# Patient Record
Sex: Female | Born: 1935 | Race: Black or African American | Hispanic: No | Smoking: Former smoker
Health system: Southern US, Community
[De-identification: ages and names within clinical notes are randomized; demographics above are authoritative.]

## PROBLEM LIST (undated history)

## (undated) DIAGNOSIS — I10 Essential (primary) hypertension: Secondary | ICD-10-CM

## (undated) DIAGNOSIS — R5381 Other malaise: Secondary | ICD-10-CM

## (undated) DIAGNOSIS — R079 Chest pain, unspecified: Secondary | ICD-10-CM

## (undated) DIAGNOSIS — H269 Unspecified cataract: Secondary | ICD-10-CM

## (undated) DIAGNOSIS — M2550 Pain in unspecified joint: Secondary | ICD-10-CM

## (undated) DIAGNOSIS — E785 Hyperlipidemia, unspecified: Secondary | ICD-10-CM

## (undated) DIAGNOSIS — R5383 Other fatigue: Secondary | ICD-10-CM

## (undated) DIAGNOSIS — M109 Gout, unspecified: Secondary | ICD-10-CM

## (undated) DIAGNOSIS — I2510 Atherosclerotic heart disease of native coronary artery without angina pectoris: Secondary | ICD-10-CM

## (undated) DIAGNOSIS — G44209 Tension-type headache, unspecified, not intractable: Secondary | ICD-10-CM

## (undated) DIAGNOSIS — F0150 Vascular dementia without behavioral disturbance: Secondary | ICD-10-CM

## (undated) DIAGNOSIS — M8580 Other specified disorders of bone density and structure, unspecified site: Secondary | ICD-10-CM

## (undated) HISTORY — PX: SPINE SURGERY: SHX786

## (undated) HISTORY — PX: CARDIAC CATHETERIZATION: SHX172

## (undated) HISTORY — DX: Gout, unspecified: M10.9

## (undated) HISTORY — DX: Tension-type headache, unspecified, not intractable: G44.209

## (undated) HISTORY — DX: Pain in unspecified joint: M25.50

## (undated) HISTORY — DX: Hyperlipidemia, unspecified: E78.5

## (undated) HISTORY — DX: Chest pain, unspecified: R07.9

## (undated) HISTORY — DX: Other fatigue: R53.83

## (undated) HISTORY — DX: Other specified disorders of bone density and structure, unspecified site: M85.80

## (undated) HISTORY — DX: Vascular dementia, unspecified severity, without behavioral disturbance, psychotic disturbance, mood disturbance, and anxiety: F01.50

## (undated) HISTORY — DX: Atherosclerotic heart disease of native coronary artery without angina pectoris: I25.10

## (undated) HISTORY — DX: Other malaise: R53.81

## (undated) HISTORY — DX: Unspecified cataract: H26.9

## (undated) HISTORY — DX: Essential (primary) hypertension: I10

---

## 1958-09-09 HISTORY — PX: TUBAL LIGATION: SHX77

## 2008-09-09 DIAGNOSIS — H269 Unspecified cataract: Secondary | ICD-10-CM

## 2008-09-09 HISTORY — DX: Unspecified cataract: H26.9

## 2010-12-13 ENCOUNTER — Emergency Department (HOSPITAL_COMMUNITY)
Admission: EM | Admit: 2010-12-13 | Discharge: 2010-12-13 | Disposition: A | Discharge status: Home / Self Care | Payer: Medicare Other | Attending: Emergency Medicine | Admitting: Emergency Medicine

## 2010-12-13 DIAGNOSIS — F0390 Unspecified dementia without behavioral disturbance: Secondary | ICD-10-CM | POA: Insufficient documentation

## 2010-12-13 DIAGNOSIS — I2510 Atherosclerotic heart disease of native coronary artery without angina pectoris: Secondary | ICD-10-CM | POA: Insufficient documentation

## 2010-12-13 DIAGNOSIS — I10 Essential (primary) hypertension: Secondary | ICD-10-CM | POA: Insufficient documentation

## 2010-12-13 DIAGNOSIS — M7660 Achilles tendinitis, unspecified leg: Secondary | ICD-10-CM | POA: Insufficient documentation

## 2010-12-13 DIAGNOSIS — M79609 Pain in unspecified limb: Secondary | ICD-10-CM | POA: Insufficient documentation

## 2011-01-24 ENCOUNTER — Other Ambulatory Visit: Payer: Self-pay | Admitting: Internal Medicine

## 2011-01-24 DIAGNOSIS — Z780 Asymptomatic menopausal state: Secondary | ICD-10-CM

## 2011-01-24 DIAGNOSIS — Z1231 Encounter for screening mammogram for malignant neoplasm of breast: Secondary | ICD-10-CM

## 2011-02-01 ENCOUNTER — Ambulatory Visit
Admission: RE | Admit: 2011-02-01 | Discharge: 2011-02-01 | Disposition: A | Discharge status: Home / Self Care | Payer: Medicare Other | Source: Ambulatory Visit | Attending: Internal Medicine | Admitting: Internal Medicine

## 2011-02-01 DIAGNOSIS — Z1231 Encounter for screening mammogram for malignant neoplasm of breast: Secondary | ICD-10-CM

## 2011-02-01 DIAGNOSIS — Z780 Asymptomatic menopausal state: Secondary | ICD-10-CM

## 2011-02-01 LAB — HM DEXA SCAN: HM Dexa Scan: NORMAL

## 2011-08-16 ENCOUNTER — Encounter (HOSPITAL_COMMUNITY): Payer: Self-pay | Admitting: *Deleted

## 2011-08-16 ENCOUNTER — Emergency Department (HOSPITAL_COMMUNITY)
Admission: EM | Admit: 2011-08-16 | Discharge: 2011-08-17 | Disposition: A | Discharge status: Home / Self Care | Payer: Medicare Other | Attending: Emergency Medicine | Admitting: Emergency Medicine

## 2011-08-16 DIAGNOSIS — R10814 Left lower quadrant abdominal tenderness: Secondary | ICD-10-CM | POA: Insufficient documentation

## 2011-08-16 DIAGNOSIS — R109 Unspecified abdominal pain: Secondary | ICD-10-CM | POA: Insufficient documentation

## 2011-08-16 DIAGNOSIS — N390 Urinary tract infection, site not specified: Secondary | ICD-10-CM | POA: Insufficient documentation

## 2011-08-16 DIAGNOSIS — K5900 Constipation, unspecified: Secondary | ICD-10-CM | POA: Insufficient documentation

## 2011-08-16 NOTE — ED Notes (Signed)
Pt in c/o abd pain x1 month with diarrhea, states decreased intake, also some twitching that she been evaluated for by PMD with no diagnosis

## 2011-08-17 ENCOUNTER — Emergency Department (HOSPITAL_COMMUNITY): Payer: Medicare Other

## 2011-08-17 LAB — URINALYSIS, ROUTINE W REFLEX MICROSCOPIC
Nitrite: NEGATIVE
Specific Gravity, Urine: 1.025 (ref 1.005–1.030)
Urobilinogen, UA: 0.2 mg/dL (ref 0.0–1.0)
pH: 6 (ref 5.0–8.0)

## 2011-08-17 LAB — DIFFERENTIAL
Basophils Relative: 0 % (ref 0–1)
Eosinophils Absolute: 0.2 10*3/uL (ref 0.0–0.7)
Eosinophils Relative: 5 % (ref 0–5)
Lymphs Abs: 2.6 10*3/uL (ref 0.7–4.0)
Neutrophils Relative %: 35 % — ABNORMAL LOW (ref 43–77)

## 2011-08-17 LAB — CBC
MCH: 28.4 pg (ref 26.0–34.0)
MCHC: 33.1 g/dL (ref 30.0–36.0)
MCV: 85.7 fL (ref 78.0–100.0)
Platelets: 238 10*3/uL (ref 150–400)
RBC: 4.19 MIL/uL (ref 3.87–5.11)

## 2011-08-17 LAB — COMPREHENSIVE METABOLIC PANEL
ALT: 7 U/L (ref 0–35)
Albumin: 3.9 g/dL (ref 3.5–5.2)
BUN: 14 mg/dL (ref 6–23)
Calcium: 10.2 mg/dL (ref 8.4–10.5)
GFR calc Af Amer: 44 mL/min — ABNORMAL LOW (ref >90)
Glucose, Bld: 86 mg/dL (ref 70–99)
Potassium: 3.8 mEq/L (ref 3.5–5.1)
Sodium: 142 mEq/L (ref 135–145)
Total Protein: 7.3 g/dL (ref 6.0–8.3)

## 2011-08-17 LAB — URINE MICROSCOPIC-ADD ON

## 2011-08-17 LAB — LIPASE, BLOOD: Lipase: 35 U/L (ref 11–59)

## 2011-08-17 MED ORDER — ONDANSETRON HCL 4 MG/2ML IJ SOLN
4.0000 mg | Freq: Once | INTRAMUSCULAR | Status: AC
  Administered 2011-08-17: 4 mg via INTRAVENOUS
  Filled 2011-08-17: qty 2

## 2011-08-17 MED ORDER — HYDROMORPHONE HCL PF 1 MG/ML IJ SOLN
0.5000 mg | Freq: Once | INTRAMUSCULAR | Status: AC
  Administered 2011-08-17: 0.5 mg via INTRAVENOUS
  Filled 2011-08-17: qty 1

## 2011-08-17 MED ORDER — SODIUM CHLORIDE 0.9 % IV SOLN
Freq: Once | INTRAVENOUS | Status: AC
  Administered 2011-08-17: 04:00:00 via INTRAVENOUS

## 2011-08-17 MED ORDER — POLYETHYLENE GLYCOL 3350 17 G PO PACK: 17.0000 g | PACK | Freq: Every day | ORAL | Status: AC

## 2011-08-17 MED ORDER — NITROFURANTOIN MONOHYD MACRO 100 MG PO CAPS: 100.0000 mg | ORAL_CAPSULE | Freq: Two times a day (BID) | ORAL | Status: AC

## 2011-08-17 MED ORDER — IOHEXOL 300 MG/ML  SOLN
100.0000 mL | Freq: Once | INTRAMUSCULAR | Status: AC | PRN
  Administered 2011-08-17: 100 mL via INTRAVENOUS

## 2011-08-17 NOTE — ED Provider Notes (Signed)
History     CSN: [id number] Arrival date & time: 08/16/2011  9:53 PM   First MD Initiated Contact with Patient 08/17/11 0249      Chief Complaint  Patient presents with  . Abdominal Pain    (Consider location/radiation/quality/duration/timing/severity/associated sxs/prior treatment) HPI Comments: Patient here with a month history of diffuse abdominal pain and diarrhea, she states she really has no appetite but is able to keep down po food and fluids, reports no weight loss, denies nausea, vomiting or constipation.  She states that she has been seen by her PCP who did not order any testing, she denies urinary problems, but complains also of periodic jerking that is not related to the pain.  Patient is a 77 y.o. female presenting with abdominal pain. The history is provided by the patient and a relative. No language interpreter was used.  Abdominal Pain The primary symptoms of the illness include abdominal pain and diarrhea. The primary symptoms of the illness do not include fever, fatigue, shortness of breath, nausea, vomiting, hematemesis, hematochezia or dysuria. The current episode started more than 2 days ago. The onset of the illness was gradual. The problem has been gradually worsening.  The patient states that she believes she is currently not pregnant. The patient has not had a change in bowel habit. Risk factors for an acute abdominal problem include being elderly. Additional symptoms associated with the illness include anorexia. Symptoms associated with the illness do not include chills, diaphoresis, heartburn, constipation, urgency, hematuria, frequency or back pain.    History reviewed. No pertinent past medical history.  History reviewed. No pertinent past surgical history.  History reviewed. No pertinent family history.  History  Substance Use Topics  . Smoking status: Not on file  . Smokeless tobacco: Not on file  . Alcohol Use: Not on file    OB History    Grav Para  Term Preterm Abortions TAB SAB Ect Mult Living                  Review of Systems  Constitutional: Negative for fever, chills, diaphoresis and fatigue.  Respiratory: Negative for shortness of breath.   Gastrointestinal: Positive for abdominal pain, diarrhea and anorexia. Negative for heartburn, nausea, vomiting, constipation, hematochezia and hematemesis.  Genitourinary: Negative for dysuria, urgency, frequency and hematuria.  Musculoskeletal: Negative for back pain.  All other systems reviewed and are negative.    Allergies  Review of patient's allergies indicates no known allergies.  Home Medications   Current Outpatient Rx  Name Route Sig Dispense Refill  . RISAQUAD PO CAPS Oral Take 1 capsule by mouth daily.      Marland Kitchen AMLODIPINE BESYLATE 5 MG PO TABS Oral Take 5 mg by mouth daily.      . ASPIRIN EC 81 MG PO TBEC Oral Take 81 mg by mouth at bedtime.      . CELECOXIB 200 MG PO CAPS Oral Take 200 mg by mouth 2 (two) times daily.      Marland Kitchen VITAMIN D 1000 UNITS PO TABS Oral Take 1,000 Units by mouth daily.      Marland Kitchen CLOPIDOGREL BISULFATE 75 MG PO TABS Oral Take 75 mg by mouth daily.      . CYANOCOBALAMIN 1000 MCG/ML IJ SOLN Intramuscular Inject 1,000 mcg into the muscle every 30 (thirty) days.      . DOCUSATE SODIUM 100 MG PO CAPS Oral Take 100 mg by mouth daily.      . DONEPEZIL HCL 10 MG PO TABS  Oral Take 10 mg by mouth at bedtime.      Marland Kitchen EZETIMIBE-SIMVASTATIN 10-40 MG PO TABS Oral Take 1 tablet by mouth at bedtime.      Marland Kitchen LORATADINE 10 MG PO TABS Oral Take 10 mg by mouth daily.      Marland Kitchen METOPROLOL SUCCINATE ER 25 MG PO TB24 Oral Take 12.5 mg by mouth daily.      Marland Kitchen NITROGLYCERIN 0.4 MG SL SUBL Sublingual Place 0.4 mg under the tongue every 5 (five) minutes as needed. Chest pain     . RANITIDINE HCL 300 MG PO CAPS Oral Take 300 mg by mouth every evening.      Marland Kitchen RANOLAZINE 500 MG PO TB12 Oral Take 500 mg by mouth 2 (two) times daily.      . TRAMADOL HCL 50 MG PO TABS Oral Take 50 mg by mouth  every 6 (six) hours as needed. For pain  Maximum dose= 8 tablets per day       BP 166/91  Pulse 67  Temp(Src) 97.9 F (36.6 C) (Oral)  Resp 20  SpO2 98%  Physical Exam  Nursing note and vitals reviewed. Constitutional: She is oriented to person, place, and time. She appears well-developed and well-nourished. No distress.  HENT:  Head: Normocephalic and atraumatic.  Right Ear: External ear normal.  Left Ear: External ear normal.  Mouth/Throat: Oropharynx is clear and moist. No oropharyngeal exudate.  Eyes: Conjunctivae are normal. Pupils are equal, round, and reactive to light. No scleral icterus.  Neck: Normal range of motion. Neck supple.  Cardiovascular: Normal rate, regular rhythm, normal heart sounds and intact distal pulses.   Pulmonary/Chest: Effort normal and breath sounds normal. She exhibits no tenderness.  Abdominal: Soft. Bowel sounds are normal. There is tenderness in the left lower quadrant. There is no rigidity and no guarding.  Musculoskeletal: Normal range of motion.  Neurological: She is alert and oriented to person, place, and time.  Skin: Skin is warm and dry.  Psychiatric: She has a normal mood and affect. Her behavior is normal. Judgment and thought content normal.    ED Course  Procedures (including critical care time)  Labs Reviewed  CBC - Abnormal; Notable for the following:    Hemoglobin 11.9 (*)    HCT 35.9 (*)    All other components within normal limits  DIFFERENTIAL - Abnormal; Notable for the following:    Neutrophils Relative 35 (*)    Lymphocytes Relative 53 (*)    All other components within normal limits  COMPREHENSIVE METABOLIC PANEL - Abnormal; Notable for the following:    Creatinine, Ser 1.24 (*)    GFR calc non Af Amer 38 (*)    GFR calc Af Amer 44 (*)    All other components within normal limits  URINALYSIS, ROUTINE W REFLEX MICROSCOPIC - Abnormal; Notable for the following:    Protein, ur 100 (*)    Leukocytes, UA MODERATE (*)     All other components within normal limits  URINE MICROSCOPIC-ADD ON - Abnormal; Notable for the following:    Squamous Epithelial / LPF FEW (*)    Bacteria, UA FEW (*)    Crystals CA OXALATE CRYSTALS (*)    All other components within normal limits  LIPASE, BLOOD   No results found. Results for orders placed during the hospital encounter of 08/16/11  CBC      Component Value Range   WBC 4.8  4.0 - 10.5 (K/uL)   RBC 4.19  3.87 -  5.11 (MIL/uL)   Hemoglobin 11.9 (*) 12.0 - 15.0 (g/dL)   HCT 16.1 (*) 09.6 - 46.0 (%)   MCV 85.7  78.0 - 100.0 (fL)   MCH 28.4  26.0 - 34.0 (pg)   MCHC 33.1  30.0 - 36.0 (g/dL)   RDW 04.5  40.9 - 81.1 (%)   Platelets 238  150 - 400 (K/uL)  DIFFERENTIAL      Component Value Range   Neutrophils Relative 35 (*) 43 - 77 (%)   Neutro Abs 1.7  1.7 - 7.7 (K/uL)   Lymphocytes Relative 53 (*) 12 - 46 (%)   Lymphs Abs 2.6  0.7 - 4.0 (K/uL)   Monocytes Relative 7  3 - 12 (%)   Monocytes Absolute 0.3  0.1 - 1.0 (K/uL)   Eosinophils Relative 5  0 - 5 (%)   Eosinophils Absolute 0.2  0.0 - 0.7 (K/uL)   Basophils Relative 0  0 - 1 (%)   Basophils Absolute 0.0  0.0 - 0.1 (K/uL)  COMPREHENSIVE METABOLIC PANEL      Component Value Range   Sodium 142  135 - 145 (mEq/L)   Potassium 3.8  3.5 - 5.1 (mEq/L)   Chloride 106  96 - 112 (mEq/L)   CO2 26  19 - 32 (mEq/L)   Glucose, Bld 86  70 - 99 (mg/dL)   BUN 14  6 - 23 (mg/dL)   Creatinine, Ser 9.14 (*) 0.50 - 1.10 (mg/dL)   Calcium 78.2  8.4 - 10.5 (mg/dL)   Total Protein 7.3  6.0 - 8.3 (g/dL)   Albumin 3.9  3.5 - 5.2 (g/dL)   AST 14  0 - 37 (U/L)   ALT 7  0 - 35 (U/L)   Alkaline Phosphatase 79  39 - 117 (U/L)   Total Bilirubin 0.3  0.3 - 1.2 (mg/dL)   GFR calc non Af Amer 38 (*) >90 (mL/min)   GFR calc Af Amer 44 (*) >90 (mL/min)  LIPASE, BLOOD      Component Value Range   Lipase 35  11 - 59 (U/L)  URINALYSIS, ROUTINE W REFLEX MICROSCOPIC      Component Value Range   Color, Urine YELLOW  YELLOW    APPearance  CLEAR  CLEAR    Specific Gravity, Urine 1.025  1.005 - 1.030    pH 6.0  5.0 - 8.0    Glucose, UA NEGATIVE  NEGATIVE (mg/dL)   Hgb urine dipstick NEGATIVE  NEGATIVE    Bilirubin Urine NEGATIVE  NEGATIVE    Ketones, ur NEGATIVE  NEGATIVE (mg/dL)   Protein, ur 956 (*) NEGATIVE (mg/dL)   Urobilinogen, UA 0.2  0.0 - 1.0 (mg/dL)   Nitrite NEGATIVE  NEGATIVE    Leukocytes, UA MODERATE (*) NEGATIVE   URINE MICROSCOPIC-ADD ON      Component Value Range   Squamous Epithelial / LPF FEW (*) RARE    WBC, UA 21-50  <3 (WBC/hpf)   RBC / HPF 0-2  <3 (RBC/hpf)   Bacteria, UA FEW (*) RARE    Crystals CA OXALATE CRYSTALS (*) NEGATIVE    Urine-Other MUCOUS PRESENT     Ct Abdomen Pelvis W Contrast  08/17/2011  *RADIOLOGY REPORT*  Clinical Data: Abdominal pain and unable to eat.  CT ABDOMEN AND PELVIS WITH CONTRAST  Technique:  Multidetector CT imaging of the abdomen and pelvis was performed following the standard protocol during bolus administration of intravenous contrast.  Contrast: OMNIPAQUE IOHEXOL 300 MG/ML IV SOLN  Comparison:  None.  Findings: Mild dependent atelectasis in the lung bases.  The liver, spleen, gallbladder, pancreas, and adrenal glands are unremarkable. Calcifications in the soft tissues of the liver hilum, likely to represent calcified granulomas.  Multiple cysts in the kidneys bilaterally.  No solid mass or hydronephrosis demonstrated. Calcification of the abdominal aorta without aneurysm.  The gastric wall is not thickened.  The small bowel are decompressed.  No free air or free fluid in the abdomen.  The colon is diffusely stool filled without wall thickening or distension.  Scattered diverticula.  Pelvis:  No free or loculated pelvic fluid collections.  The uterus is not visualized and may be a or exclude absent.  No abnormal adnexal masses.  Small amount of fat in the emboli this.  The appendix is not visualized but no inflammatory changes are demonstrated in this area.  Scattered  diverticula in the sigmoid colon without inflammatory change.  Visualized pelvic lymph nodes are not abnormally enlarged.  Degenerative changes in the lumbar spine.  Probable postoperative changes in the lower lumbar spine. Mild anterior subluxation of L3 on L4.  This appears to be due to spondylolysis.  IMPRESSION: No acute process demonstrated in the abdomen or pelvis. 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  Bilateral renal cysts.  Vascular calcifications.  Original Report Authenticated By: Marlon Pel, M.D.     Constipation UTI    MDM  Patient reports improvement in pain after medication - CT scan shows stool in colon but no other abnormalities.  Labs also look good with the exception of UTI.  Will treat for this and the patient will follow up with Dr. Azucena Kuba, her PCP for further evaluation.  Will send home on Miralax as well.        Izola Price [place], [place] 08/17/11 1610  Medical screening examination/treatment/procedure(s) were conducted as a shared visit with non-physician practitioner(s) and myself.  I personally evaluated the patient during the encounter Patient feeling better after having a bowel movement. No blood in stools. CT scan reviewed as above. Abdominal exam is soft nontender and no peritonitis with bowel sounds present. Stable for discharge home and outpatient followup as needed.  Sunnie Nielsen, MD 08/17/11 [phone]

## 2011-08-17 NOTE — ED Notes (Signed)
Pt presented to ed via ems accompanied by daughter c/o on and off jerky movements x 7 months, also c/o abdominal pain scale 9/10 associated with nausea, denies vomiting

## 2011-08-17 NOTE — ED Notes (Signed)
Per daughter pt seen by her PCP Bufford Spikes, 31th, yearly physical, at that time no specific test were done, pt about month ago started to have abd pains and for several months had developed intermittent tremors/jitters. Pt has not been able to eat, decreased appetite, today s/s got worsen, pt also on Pepto, not helping.

## 2011-09-20 DIAGNOSIS — Z9861 Coronary angioplasty status: Secondary | ICD-10-CM | POA: Diagnosis not present

## 2011-09-20 DIAGNOSIS — R011 Cardiac murmur, unspecified: Secondary | ICD-10-CM | POA: Diagnosis not present

## 2011-09-20 DIAGNOSIS — I10 Essential (primary) hypertension: Secondary | ICD-10-CM | POA: Diagnosis not present

## 2011-09-20 DIAGNOSIS — I2510 Atherosclerotic heart disease of native coronary artery without angina pectoris: Secondary | ICD-10-CM | POA: Diagnosis not present

## 2011-11-18 ENCOUNTER — Other Ambulatory Visit: Payer: Self-pay | Admitting: Internal Medicine

## 2011-11-18 DIAGNOSIS — K3189 Other diseases of stomach and duodenum: Secondary | ICD-10-CM | POA: Diagnosis not present

## 2011-11-18 DIAGNOSIS — M19019 Primary osteoarthritis, unspecified shoulder: Secondary | ICD-10-CM | POA: Diagnosis not present

## 2011-11-18 DIAGNOSIS — R413 Other amnesia: Secondary | ICD-10-CM | POA: Diagnosis not present

## 2011-11-18 DIAGNOSIS — I2510 Atherosclerotic heart disease of native coronary artery without angina pectoris: Secondary | ICD-10-CM | POA: Diagnosis not present

## 2011-11-18 DIAGNOSIS — R109 Unspecified abdominal pain: Secondary | ICD-10-CM | POA: Diagnosis not present

## 2011-11-18 DIAGNOSIS — M25559 Pain in unspecified hip: Secondary | ICD-10-CM | POA: Diagnosis not present

## 2011-11-18 DIAGNOSIS — J309 Allergic rhinitis, unspecified: Secondary | ICD-10-CM | POA: Diagnosis not present

## 2011-11-18 DIAGNOSIS — R1011 Right upper quadrant pain: Secondary | ICD-10-CM

## 2011-11-20 DIAGNOSIS — I059 Rheumatic mitral valve disease, unspecified: Secondary | ICD-10-CM | POA: Diagnosis not present

## 2011-11-26 ENCOUNTER — Ambulatory Visit: Payer: Medicare Other

## 2011-12-03 ENCOUNTER — Ambulatory Visit
Admission: RE | Admit: 2011-12-03 | Discharge: 2011-12-03 | Disposition: A | Discharge status: Home / Self Care | Payer: Medicare Other | Source: Ambulatory Visit | Attending: Internal Medicine | Admitting: Internal Medicine

## 2011-12-03 DIAGNOSIS — N281 Cyst of kidney, acquired: Secondary | ICD-10-CM | POA: Diagnosis not present

## 2011-12-03 DIAGNOSIS — R109 Unspecified abdominal pain: Secondary | ICD-10-CM | POA: Diagnosis not present

## 2011-12-03 DIAGNOSIS — R1011 Right upper quadrant pain: Secondary | ICD-10-CM

## 2011-12-17 DIAGNOSIS — I739 Peripheral vascular disease, unspecified: Secondary | ICD-10-CM | POA: Diagnosis not present

## 2011-12-17 DIAGNOSIS — L84 Corns and callosities: Secondary | ICD-10-CM | POA: Diagnosis not present

## 2011-12-17 DIAGNOSIS — L608 Other nail disorders: Secondary | ICD-10-CM | POA: Diagnosis not present

## 2011-12-17 DIAGNOSIS — M2040 Other hammer toe(s) (acquired), unspecified foot: Secondary | ICD-10-CM | POA: Diagnosis not present

## 2011-12-17 DIAGNOSIS — L97909 Non-pressure chronic ulcer of unspecified part of unspecified lower leg with unspecified severity: Secondary | ICD-10-CM | POA: Diagnosis not present

## 2012-01-13 ENCOUNTER — Other Ambulatory Visit: Payer: Self-pay | Admitting: Internal Medicine

## 2012-01-13 DIAGNOSIS — Z1231 Encounter for screening mammogram for malignant neoplasm of breast: Secondary | ICD-10-CM

## 2012-02-17 ENCOUNTER — Ambulatory Visit
Admission: RE | Admit: 2012-02-17 | Discharge: 2012-02-17 | Disposition: A | Discharge status: Home / Self Care | Payer: Medicare Other | Source: Ambulatory Visit | Attending: Internal Medicine | Admitting: Internal Medicine

## 2012-02-17 DIAGNOSIS — Z1231 Encounter for screening mammogram for malignant neoplasm of breast: Secondary | ICD-10-CM | POA: Diagnosis not present

## 2012-02-20 DIAGNOSIS — B351 Tinea unguium: Secondary | ICD-10-CM | POA: Diagnosis not present

## 2012-02-20 DIAGNOSIS — M79609 Pain in unspecified limb: Secondary | ICD-10-CM | POA: Diagnosis not present

## 2012-03-24 ENCOUNTER — Other Ambulatory Visit: Payer: Self-pay | Admitting: Gastroenterology

## 2012-03-24 DIAGNOSIS — R109 Unspecified abdominal pain: Secondary | ICD-10-CM | POA: Diagnosis not present

## 2012-03-24 DIAGNOSIS — K649 Unspecified hemorrhoids: Secondary | ICD-10-CM | POA: Diagnosis not present

## 2012-03-24 DIAGNOSIS — K3189 Other diseases of stomach and duodenum: Secondary | ICD-10-CM | POA: Diagnosis not present

## 2012-03-31 ENCOUNTER — Other Ambulatory Visit: Payer: Medicare Other

## 2012-04-09 ENCOUNTER — Ambulatory Visit (HOSPITAL_COMMUNITY)
Admission: RE | Admit: 2012-04-09 | Discharge: 2012-04-09 | Disposition: A | Discharge status: Home / Self Care | Payer: Medicare Other | Source: Ambulatory Visit | Attending: Gastroenterology | Admitting: Gastroenterology

## 2012-04-09 DIAGNOSIS — K224 Dyskinesia of esophagus: Secondary | ICD-10-CM | POA: Diagnosis not present

## 2012-04-09 DIAGNOSIS — R112 Nausea with vomiting, unspecified: Secondary | ICD-10-CM | POA: Diagnosis not present

## 2012-04-09 DIAGNOSIS — R109 Unspecified abdominal pain: Secondary | ICD-10-CM

## 2012-04-10 DIAGNOSIS — R109 Unspecified abdominal pain: Secondary | ICD-10-CM | POA: Diagnosis not present

## 2012-04-10 DIAGNOSIS — R197 Diarrhea, unspecified: Secondary | ICD-10-CM | POA: Diagnosis not present

## 2012-04-13 DIAGNOSIS — R109 Unspecified abdominal pain: Secondary | ICD-10-CM | POA: Diagnosis not present

## 2012-04-14 ENCOUNTER — Other Ambulatory Visit: Payer: Medicare Other

## 2012-04-24 DIAGNOSIS — R1033 Periumbilical pain: Secondary | ICD-10-CM | POA: Diagnosis not present

## 2012-05-18 DIAGNOSIS — B351 Tinea unguium: Secondary | ICD-10-CM | POA: Diagnosis not present

## 2012-05-18 DIAGNOSIS — M79609 Pain in unspecified limb: Secondary | ICD-10-CM | POA: Diagnosis not present

## 2012-05-20 DIAGNOSIS — K649 Unspecified hemorrhoids: Secondary | ICD-10-CM | POA: Diagnosis not present

## 2012-05-20 DIAGNOSIS — R109 Unspecified abdominal pain: Secondary | ICD-10-CM | POA: Diagnosis not present

## 2012-05-29 DIAGNOSIS — Z961 Presence of intraocular lens: Secondary | ICD-10-CM | POA: Diagnosis not present

## 2012-05-29 DIAGNOSIS — H35039 Hypertensive retinopathy, unspecified eye: Secondary | ICD-10-CM | POA: Diagnosis not present

## 2012-05-29 DIAGNOSIS — H43819 Vitreous degeneration, unspecified eye: Secondary | ICD-10-CM | POA: Diagnosis not present

## 2012-06-08 DIAGNOSIS — I10 Essential (primary) hypertension: Secondary | ICD-10-CM | POA: Diagnosis not present

## 2012-06-08 DIAGNOSIS — G319 Degenerative disease of nervous system, unspecified: Secondary | ICD-10-CM | POA: Diagnosis not present

## 2012-06-08 DIAGNOSIS — E559 Vitamin D deficiency, unspecified: Secondary | ICD-10-CM | POA: Diagnosis not present

## 2012-06-08 DIAGNOSIS — M899 Disorder of bone, unspecified: Secondary | ICD-10-CM | POA: Diagnosis not present

## 2012-06-08 DIAGNOSIS — I2510 Atherosclerotic heart disease of native coronary artery without angina pectoris: Secondary | ICD-10-CM | POA: Diagnosis not present

## 2012-06-08 DIAGNOSIS — M2550 Pain in unspecified joint: Secondary | ICD-10-CM | POA: Diagnosis not present

## 2012-06-08 DIAGNOSIS — M109 Gout, unspecified: Secondary | ICD-10-CM | POA: Diagnosis not present

## 2012-09-17 ENCOUNTER — Emergency Department (HOSPITAL_COMMUNITY)
Admission: EM | Admit: 2012-09-17 | Discharge: 2012-09-17 | Disposition: A | Discharge status: Home / Self Care | Payer: Medicare Other | Attending: Emergency Medicine | Admitting: Emergency Medicine

## 2012-09-17 ENCOUNTER — Encounter (HOSPITAL_COMMUNITY): Payer: Self-pay | Admitting: Emergency Medicine

## 2012-09-17 ENCOUNTER — Emergency Department (HOSPITAL_COMMUNITY): Payer: Medicare Other

## 2012-09-17 DIAGNOSIS — R1032 Left lower quadrant pain: Secondary | ICD-10-CM | POA: Diagnosis not present

## 2012-09-17 DIAGNOSIS — K5900 Constipation, unspecified: Secondary | ICD-10-CM | POA: Diagnosis not present

## 2012-09-17 DIAGNOSIS — Z79899 Other long term (current) drug therapy: Secondary | ICD-10-CM | POA: Diagnosis not present

## 2012-09-17 DIAGNOSIS — Z7982 Long term (current) use of aspirin: Secondary | ICD-10-CM | POA: Diagnosis not present

## 2012-09-17 DIAGNOSIS — N281 Cyst of kidney, acquired: Secondary | ICD-10-CM | POA: Diagnosis not present

## 2012-09-17 DIAGNOSIS — R1084 Generalized abdominal pain: Secondary | ICD-10-CM | POA: Diagnosis not present

## 2012-09-17 DIAGNOSIS — R109 Unspecified abdominal pain: Secondary | ICD-10-CM

## 2012-09-17 LAB — COMPREHENSIVE METABOLIC PANEL
Albumin: 3.8 g/dL (ref 3.5–5.2)
BUN: 16 mg/dL (ref 6–23)
Calcium: 10.2 mg/dL (ref 8.4–10.5)
Chloride: 100 mEq/L (ref 96–112)
Creatinine, Ser: 1.08 mg/dL (ref 0.50–1.10)
Total Bilirubin: 0.5 mg/dL (ref 0.3–1.2)
Total Protein: 7.3 g/dL (ref 6.0–8.3)

## 2012-09-17 LAB — URINALYSIS, ROUTINE W REFLEX MICROSCOPIC
Glucose, UA: NEGATIVE mg/dL
Leukocytes, UA: NEGATIVE
Protein, ur: NEGATIVE mg/dL
Urobilinogen, UA: 0.2 mg/dL (ref 0.0–1.0)

## 2012-09-17 LAB — LIPASE, BLOOD: Lipase: 42 U/L (ref 11–59)

## 2012-09-17 LAB — CBC WITH DIFFERENTIAL/PLATELET
Basophils Relative: 1 % (ref 0–1)
Eosinophils Absolute: 0.2 10*3/uL (ref 0.0–0.7)
Eosinophils Relative: 4 % (ref 0–5)
HCT: 37.4 % (ref 36.0–46.0)
Hemoglobin: 12.6 g/dL (ref 12.0–15.0)
MCH: 28.6 pg (ref 26.0–34.0)
MCHC: 33.7 g/dL (ref 30.0–36.0)
MCV: 85 fL (ref 78.0–100.0)
Monocytes Absolute: 0.3 10*3/uL (ref 0.1–1.0)
Monocytes Relative: 7 % (ref 3–12)

## 2012-09-17 MED ORDER — IOHEXOL 300 MG/ML  SOLN
100.0000 mL | Freq: Once | INTRAMUSCULAR | Status: AC | PRN
  Administered 2012-09-17: 100 mL via INTRAVENOUS

## 2012-09-17 NOTE — ED Provider Notes (Signed)
History     CSN: [id number]  Arrival date & time 09/17/12  1235   First MD Initiated Contact with Patient 09/17/12 1309      Chief Complaint  Patient presents with  . LLQ pain     (Consider location/radiation/quality/duration/timing/severity/associated sxs/prior treatment) HPI Comments: Patient presents with a one-week history of worsening abdominal pain. She denies any nausea vomiting or diarrhea. She has intermittent constipation. She denies he fevers or chills. She denies any urinary symptoms. She states her pain has been constant and worsening over the last week. She states she's had a history of abdominal problems in the past and about one year ago had similar pain with a CT scan that was negative.   History reviewed. No pertinent past medical history.  History reviewed. No pertinent past surgical history.  No family history on file.  History  Substance Use Topics  . Smoking status: Never Smoker   . Smokeless tobacco: Not on file  . Alcohol Use: No    OB History    Grav Para Term Preterm Abortions TAB SAB Ect Mult Living                  Review of Systems  Constitutional: Negative for fever, chills, diaphoresis and fatigue.  HENT: Negative for congestion, rhinorrhea and sneezing.   Eyes: Negative.   Respiratory: Negative for cough, chest tightness and shortness of breath.   Cardiovascular: Negative for chest pain and leg swelling.  Gastrointestinal: Positive for abdominal pain and constipation. Negative for nausea, vomiting, diarrhea and blood in stool.  Genitourinary: Negative for frequency, hematuria, flank pain and difficulty urinating.  Musculoskeletal: Negative for back pain and arthralgias.  Skin: Negative for rash.  Neurological: Negative for dizziness, speech difficulty, weakness, numbness and headaches.    Allergies  Review of patient's allergies indicates no known allergies.  Home Medications   Current Outpatient Rx  Name  Route  Sig  Dispense   Refill  . RISAQUAD PO CAPS   Oral   Take 1 capsule by mouth daily.           Marland Kitchen AMLODIPINE BESYLATE 5 MG PO TABS   Oral   Take 5 mg by mouth daily.           . ASPIRIN EC 81 MG PO TBEC   Oral   Take 81 mg by mouth at bedtime.           Marland Kitchen VITAMIN D 1000 UNITS PO TABS   Oral   Take 2,000 Units by mouth daily.          Marland Kitchen CLOPIDOGREL BISULFATE 75 MG PO TABS   Oral   Take 75 mg by mouth daily.           . DONEPEZIL HCL 5 MG PO TABS   Oral   Take 5 mg by mouth at bedtime.         Marland Kitchen EZETIMIBE-SIMVASTATIN 10-40 MG PO TABS   Oral   Take 1 tablet by mouth at bedtime.           Marland Kitchen LORATADINE 10 MG PO TABS   Oral   Take 10 mg by mouth daily.           Marland Kitchen RANITIDINE HCL 300 MG PO CAPS   Oral   Take 300 mg by mouth every evening.           Marland Kitchen RANOLAZINE ER 500 MG PO TB12   Oral   Take 500 mg by  mouth 2 (two) times daily.             BP 159/89  Pulse 73  Temp 98.2 F (36.8 C) (Oral)  Resp 18  SpO2 99%  Physical Exam  Constitutional: She is oriented to person, place, and time. She appears well-developed and well-nourished.  HENT:  Head: Normocephalic and atraumatic.  Eyes: Pupils are equal, round, and reactive to light.  Neck: Normal range of motion. Neck supple.  Cardiovascular: Normal rate, regular rhythm and normal heart sounds.   Pulmonary/Chest: Effort normal and breath sounds normal. No respiratory distress. She has no wheezes. She has no rales. She exhibits no tenderness.  Abdominal: Soft. Bowel sounds are normal. There is tenderness (mild diffuse tenderness). There is no rebound and no guarding.  Genitourinary:       Brown stool. No impaction or increased stool in rectum.  Musculoskeletal: Normal range of motion. She exhibits no edema.  Lymphadenopathy:    She has no cervical adenopathy.  Neurological: She is alert and oriented to person, place, and time.  Skin: Skin is warm and dry. No rash noted.  Psychiatric: She has a normal mood and  affect.    ED Course  Procedures (including critical care time)  Results for orders placed during the hospital encounter of 09/17/12  CBC WITH DIFFERENTIAL      Component Value Range   WBC 4.5  4.0 - 10.5 K/uL   RBC 4.40  3.87 - 5.11 MIL/uL   Hemoglobin 12.6  12.0 - 15.0 g/dL   HCT 19.1  47.8 - 29.5 %   MCV 85.0  78.0 - 100.0 fL   MCH 28.6  26.0 - 34.0 pg   MCHC 33.7  30.0 - 36.0 g/dL   RDW 62.1  30.8 - 65.7 %   Platelets 254  150 - 400 K/uL   Neutrophils Relative 44  43 - 77 %   Neutro Abs 2.0  1.7 - 7.7 K/uL   Lymphocytes Relative 44  12 - 46 %   Lymphs Abs 2.0  0.7 - 4.0 K/uL   Monocytes Relative 7  3 - 12 %   Monocytes Absolute 0.3  0.1 - 1.0 K/uL   Eosinophils Relative 4  0 - 5 %   Eosinophils Absolute 0.2  0.0 - 0.7 K/uL   Basophils Relative 1  0 - 1 %   Basophils Absolute 0.0  0.0 - 0.1 K/uL  COMPREHENSIVE METABOLIC PANEL      Component Value Range   Sodium 138  135 - 145 mEq/L   Potassium 4.4  3.5 - 5.1 mEq/L   Chloride 100  96 - 112 mEq/L   CO2 27  19 - 32 mEq/L   Glucose, Bld 93  70 - 99 mg/dL   BUN 16  6 - 23 mg/dL   Creatinine, Ser 8.46  0.50 - 1.10 mg/dL   Calcium 96.2  8.4 - 95.2 mg/dL   Total Protein 7.3  6.0 - 8.3 g/dL   Albumin 3.8  3.5 - 5.2 g/dL   AST 12  0 - 37 U/L   ALT 6  0 - 35 U/L   Alkaline Phosphatase 84  39 - 117 U/L   Total Bilirubin 0.5  0.3 - 1.2 mg/dL   GFR calc non Af Amer 44 (*) >90 mL/min   GFR calc Af Amer 52 (*) >90 mL/min  URINALYSIS, ROUTINE W REFLEX MICROSCOPIC      Component Value Range   Color, Urine YELLOW  YELLOW   APPearance CLEAR  CLEAR   Specific Gravity, Urine 1.006  1.005 - 1.030   pH 7.0  5.0 - 8.0   Glucose, UA NEGATIVE  NEGATIVE mg/dL   Hgb urine dipstick NEGATIVE  NEGATIVE   Bilirubin Urine NEGATIVE  NEGATIVE   Ketones, ur NEGATIVE  NEGATIVE mg/dL   Protein, ur NEGATIVE  NEGATIVE mg/dL   Urobilinogen, UA 0.2  0.0 - 1.0 mg/dL   Nitrite NEGATIVE  NEGATIVE   Leukocytes, UA NEGATIVE  NEGATIVE  LIPASE, BLOOD       Component Value Range   Lipase 42  11 - 59 U/L   No results found.    No diagnosis found.    MDM  Pt awaiting results of CT, pt turned over to Dr Rubin Payor pending results        Rolan Bucco, MD 09/17/12 [phone]

## 2012-09-17 NOTE — ED Provider Notes (Signed)
CT reviewed and was negative. Patient feels better. She will be discharged home  Juliet Rude. Rubin Payor, MD 09/17/12 1733

## 2012-09-17 NOTE — ED Notes (Signed)
Per patient, has had LLQ pain for a week-no N/V/D-dysuria, also having "body spasms"

## 2012-09-21 DIAGNOSIS — Z79899 Other long term (current) drug therapy: Secondary | ICD-10-CM | POA: Diagnosis not present

## 2012-09-21 DIAGNOSIS — J309 Allergic rhinitis, unspecified: Secondary | ICD-10-CM | POA: Diagnosis not present

## 2012-09-21 DIAGNOSIS — Z0000 Encounter for general adult medical examination without abnormal findings: Secondary | ICD-10-CM | POA: Diagnosis not present

## 2012-09-21 DIAGNOSIS — I10 Essential (primary) hypertension: Secondary | ICD-10-CM | POA: Diagnosis not present

## 2012-09-21 DIAGNOSIS — Z23 Encounter for immunization: Secondary | ICD-10-CM | POA: Diagnosis not present

## 2012-09-21 DIAGNOSIS — M19019 Primary osteoarthritis, unspecified shoulder: Secondary | ICD-10-CM | POA: Diagnosis not present

## 2012-09-21 DIAGNOSIS — I2510 Atherosclerotic heart disease of native coronary artery without angina pectoris: Secondary | ICD-10-CM | POA: Diagnosis not present

## 2012-09-21 DIAGNOSIS — R1031 Right lower quadrant pain: Secondary | ICD-10-CM | POA: Diagnosis not present

## 2012-09-21 DIAGNOSIS — R413 Other amnesia: Secondary | ICD-10-CM | POA: Diagnosis not present

## 2012-10-02 DIAGNOSIS — M79609 Pain in unspecified limb: Secondary | ICD-10-CM | POA: Diagnosis not present

## 2012-10-02 DIAGNOSIS — L84 Corns and callosities: Secondary | ICD-10-CM | POA: Diagnosis not present

## 2012-10-02 DIAGNOSIS — B351 Tinea unguium: Secondary | ICD-10-CM | POA: Diagnosis not present

## 2012-10-19 DIAGNOSIS — B029 Zoster without complications: Secondary | ICD-10-CM | POA: Diagnosis not present

## 2012-11-09 DIAGNOSIS — G253 Myoclonus: Secondary | ICD-10-CM | POA: Diagnosis not present

## 2012-11-09 DIAGNOSIS — B029 Zoster without complications: Secondary | ICD-10-CM | POA: Diagnosis not present

## 2012-11-09 DIAGNOSIS — R1031 Right lower quadrant pain: Secondary | ICD-10-CM | POA: Diagnosis not present

## 2012-11-30 ENCOUNTER — Other Ambulatory Visit: Payer: Self-pay | Admitting: Internal Medicine

## 2012-12-09 DIAGNOSIS — B029 Zoster without complications: Secondary | ICD-10-CM | POA: Diagnosis not present

## 2012-12-09 DIAGNOSIS — G253 Myoclonus: Secondary | ICD-10-CM | POA: Diagnosis not present

## 2012-12-09 DIAGNOSIS — R1031 Right lower quadrant pain: Secondary | ICD-10-CM | POA: Diagnosis not present

## 2012-12-28 ENCOUNTER — Other Ambulatory Visit: Payer: Self-pay | Admitting: Nurse Practitioner

## 2012-12-29 ENCOUNTER — Other Ambulatory Visit: Payer: Self-pay | Admitting: *Deleted

## 2012-12-29 MED ORDER — ROLLATOR MISC: Status: DC

## 2012-12-29 MED ORDER — DONEPEZIL HCL 10 MG PO TABS: ORAL_TABLET | ORAL | Status: DC

## 2012-12-29 MED ORDER — METOPROLOL SUCCINATE ER 25 MG PO TB24: ORAL_TABLET | ORAL | Status: DC

## 2012-12-29 NOTE — Telephone Encounter (Signed)
Patient daughter walked in and needs a Rx written for a Rollator for her mother for unsteady gait. Rx. Printed for Dr. Renato Gails to sign. To call daughter, Emily Schultz, when ready to pick up # [phone].

## 2013-01-22 ENCOUNTER — Other Ambulatory Visit: Payer: Self-pay | Admitting: Internal Medicine

## 2013-02-18 ENCOUNTER — Other Ambulatory Visit: Payer: Self-pay | Admitting: Nurse Practitioner

## 2013-02-18 ENCOUNTER — Other Ambulatory Visit: Payer: Self-pay | Admitting: Internal Medicine

## 2013-03-22 DIAGNOSIS — M79609 Pain in unspecified limb: Secondary | ICD-10-CM | POA: Diagnosis not present

## 2013-03-22 DIAGNOSIS — B351 Tinea unguium: Secondary | ICD-10-CM | POA: Diagnosis not present

## 2013-03-22 DIAGNOSIS — G253 Myoclonus: Secondary | ICD-10-CM | POA: Diagnosis not present

## 2013-03-22 DIAGNOSIS — L84 Corns and callosities: Secondary | ICD-10-CM | POA: Diagnosis not present

## 2013-03-22 DIAGNOSIS — R1031 Right lower quadrant pain: Secondary | ICD-10-CM | POA: Diagnosis not present

## 2013-03-22 DIAGNOSIS — B0229 Other postherpetic nervous system involvement: Secondary | ICD-10-CM | POA: Diagnosis not present

## 2013-04-12 ENCOUNTER — Other Ambulatory Visit: Payer: Self-pay | Admitting: Nurse Practitioner

## 2013-04-16 ENCOUNTER — Other Ambulatory Visit: Payer: Self-pay | Admitting: Internal Medicine

## 2013-04-19 ENCOUNTER — Other Ambulatory Visit: Payer: Self-pay | Admitting: Geriatric Medicine

## 2013-04-19 MED ORDER — METOPROLOL SUCCINATE ER 25 MG PO TB24: ORAL_TABLET | ORAL | Status: DC

## 2013-04-19 MED ORDER — EZETIMIBE-SIMVASTATIN 10-40 MG PO TABS: 1.0000 | ORAL_TABLET | Freq: Every day | ORAL | Status: DC

## 2013-04-19 MED ORDER — RANITIDINE HCL 300 MG PO CAPS: 300.0000 mg | ORAL_CAPSULE | Freq: Every evening | ORAL | Status: DC

## 2013-04-19 MED ORDER — DONEPEZIL HCL 10 MG PO TABS: ORAL_TABLET | ORAL | Status: DC

## 2013-04-22 ENCOUNTER — Other Ambulatory Visit: Payer: Self-pay | Admitting: Geriatric Medicine

## 2013-04-22 DIAGNOSIS — H1710 Central corneal opacity, unspecified eye: Secondary | ICD-10-CM | POA: Diagnosis not present

## 2013-04-22 MED ORDER — RANITIDINE HCL 300 MG PO CAPS: 300.0000 mg | ORAL_CAPSULE | Freq: Every evening | ORAL | Status: DC

## 2013-04-22 MED ORDER — DONEPEZIL HCL 10 MG PO TABS: 10.0000 mg | ORAL_TABLET | Freq: Every day | ORAL | Status: DC

## 2013-04-22 MED ORDER — EZETIMIBE-SIMVASTATIN 10-40 MG PO TABS: 1.0000 | ORAL_TABLET | Freq: Every day | ORAL | Status: DC

## 2013-04-22 MED ORDER — METOPROLOL SUCCINATE ER 25 MG PO TB24: 25.0000 mg | ORAL_TABLET | Freq: Every day | ORAL | Status: DC

## 2013-04-23 ENCOUNTER — Encounter: Payer: Self-pay | Admitting: *Deleted

## 2013-04-26 ENCOUNTER — Ambulatory Visit (INDEPENDENT_AMBULATORY_CARE_PROVIDER_SITE_OTHER): Payer: Medicare Other | Admitting: Internal Medicine

## 2013-04-26 ENCOUNTER — Encounter: Payer: Self-pay | Admitting: Internal Medicine

## 2013-04-26 VITALS — BP 156/90 | HR 77 | Temp 98.1°F | Resp 14 | Wt 147.4 lb

## 2013-04-26 DIAGNOSIS — F0150 Vascular dementia without behavioral disturbance: Secondary | ICD-10-CM | POA: Diagnosis not present

## 2013-04-26 DIAGNOSIS — I2510 Atherosclerotic heart disease of native coronary artery without angina pectoris: Secondary | ICD-10-CM | POA: Diagnosis not present

## 2013-04-26 DIAGNOSIS — M109 Gout, unspecified: Secondary | ICD-10-CM | POA: Diagnosis not present

## 2013-04-26 DIAGNOSIS — E785 Hyperlipidemia, unspecified: Secondary | ICD-10-CM | POA: Diagnosis not present

## 2013-04-26 DIAGNOSIS — W19XXXA Unspecified fall, initial encounter: Secondary | ICD-10-CM

## 2013-04-26 DIAGNOSIS — M8580 Other specified disorders of bone density and structure, unspecified site: Secondary | ICD-10-CM

## 2013-04-26 DIAGNOSIS — M899 Disorder of bone, unspecified: Secondary | ICD-10-CM

## 2013-04-26 DIAGNOSIS — W19XXXD Unspecified fall, subsequent encounter: Secondary | ICD-10-CM

## 2013-04-26 MED ORDER — ROLLATOR MISC: Status: DC

## 2013-04-26 NOTE — Progress Notes (Signed)
Patient ID: Emily Schultz, female   DOB: [birth year], 79 y.o.   MRN: [id number] Location:  [facility] / [place] [facility]  Code Status:   No Known Allergies  Chief Complaint  Patient presents with  . Annual Exam    EV--Fell last week and hit head and eye. Went to eye Dr. and was cleared.    HPI: Patient is a 79 y.o. black female seen in the office today for her annual physicial.  She had a fall last week hitting her head and eye.  Saw her eye doctor and there were no problems.   Is on gabapentin for shingles.  No open sores, just itchy on her right side.   She has h/o dementia on namenda and aricept, CAD, htn, gout, hyperlipidemia, and osteoarthritis.   Has lost 10 lbs in 2 mos, but says her appetite is improved.   Her daughter requested we look at her bottom.    BP a little elevated today.  Review of Systems:  Review of Systems  Constitutional: Positive for weight loss. Negative for fever and chills.  Respiratory: Negative for shortness of breath.   Cardiovascular: Negative for chest pain, palpitations and leg swelling.  Gastrointestinal: Negative for nausea, vomiting, abdominal pain, diarrhea, constipation, blood in stool and melena.  Genitourinary: Negative for dysuria, urgency and frequency.  Musculoskeletal: Positive for falls.  Skin:       Skin changes on bottom;  prior zoster on right side  Neurological: Negative for dizziness, tingling, sensory change, weakness and headaches.  Psychiatric/Behavioral: Positive for memory loss.     Past Medical History  Diagnosis Date  . Other and unspecified hyperlipidemia   . Gout, unspecified   . Tension headache   . Unspecified essential hypertension   . Coronary atherosclerosis of native coronary artery   . Pain in joint, site unspecified   . Disorder of bone and cartilage, unspecified   . Other malaise and fatigue   . Chest pain, unspecified   . Cataract 2010    Past Surgical History  Procedure Laterality  Date  . Spine surgery  [phone]  . Tubal ligation  1960    Social History:   reports that she has never smoked. She does not have any smokeless tobacco history on file. She reports that she does not drink alcohol or use illicit drugs.  History reviewed. No pertinent family history.  Medications: Patient's Medications  New Prescriptions   No medications on file  Previous Medications   ACIDOPHILUS (RISAQUAD) CAPS    Take 1 capsule by mouth daily.     AMLODIPINE (NORVASC) 5 MG TABLET    Take 5 mg by mouth daily.     ASPIRIN EC 81 MG TABLET    Take 81 mg by mouth at bedtime.     CELECOXIB (CELEBREX) 200 MG CAPSULE    Take 200 mg by mouth 2 (two) times daily.   CHOLECALCIFEROL (VITAMIN D) 1000 UNITS TABLET    Take 2,000 Units by mouth daily.    CLOPIDOGREL (PLAVIX) 75 MG TABLET    Take 75 mg by mouth daily.     DONEPEZIL (ARICEPT) 10 MG TABLET    Take 1 tablet (10 mg total) by mouth at bedtime.   DONEPEZIL (ARICEPT) 5 MG TABLET    Take 5 mg by mouth at bedtime.   EZETIMIBE-SIMVASTATIN (VYTORIN) 10-40 MG PER TABLET    Take 1 tablet by mouth at bedtime.   FISH OIL-OMEGA-3 FATTY ACIDS 1000 MG CAPSULE  Take 1,000 mg by mouth daily. Take 1 tablet daily for cholesterol.   LACTOBACILLUS RHAMNOSUS, GG, (CULTURELLE) CAPS    Take by mouth. Take 1 capsule daily.   LORATADINE (CLARITIN) 10 MG TABLET    Take 10 mg by mouth daily.     MEMANTINE (NAMENDA) 10 MG TABLET    Take 10 mg by mouth 2 (two) times daily. Take 1 tablet by mouth twice daily for memory.   METOPROLOL SUCCINATE (TOPROL-XL) 25 MG 24 HR TABLET    Take 1 tablet (25 mg total) by mouth daily. When sbp is > 150   MISC. DEVICES (ROLLATOR) MISC    Patient needs for unsteady gait   NITROGLYCERIN (NITROSTAT) 0.4 MG SL TABLET    Place 0.4 mg under the tongue every 5 (five) minutes as needed for chest pain. Place one under tongue at onset of chest pain. Repeat every 5 minutes up to twice more if needed.   PROMETHAZINE (PHENERGAN) 25 MG  SUPPOSITORY    Place 25 mg rectally every 6 (six) hours as needed for nausea.   RANEXA 500 MG 12 HR TABLET    TAKE ONE TABLET BY MOUTH TWICE DAILY   RANITIDINE (ZANTAC) 300 MG TABLET    TAKE ONE TABLET BY MOUTH DAILY TO REDUCE STOMACH ACID   TRAMADOL HCL 50 MG TBDP    Take 50 mg by mouth 3 (three) times daily. Take 1 tablet by mouth three times daily as needed for severe pain.   TRIAMTERENE-HYDROCHLOROTHIAZIDE (DYAZIDE) 37.5-25 MG PER CAPSULE    Take 1 capsule by mouth every morning. Take 1 tablet once daily for blood pressure.  Modified Medications   No medications on file  Discontinued Medications   No medications on file   Physical Exam: Filed Vitals:   04/26/13 1408  BP: 156/90  Pulse: 77  Temp: 98.1 F (36.7 C)  TempSrc: Oral  Resp: 14  Weight: 147 lb 6.4 oz (66.86 kg)  Physical Exam  Constitutional: She appears well-developed and well-nourished.  Obese black female, nad  HENT:  Head: Normocephalic and atraumatic.  Right Ear: External ear normal.  Left Ear: External ear normal.  Nose: Nose normal.  Mouth/Throat: Oropharynx is clear and moist. No oropharyngeal exudate.  Eyes: Conjunctivae and EOM are normal. Pupils are equal, round, and reactive to light.  Neck: Normal range of motion. Neck supple. No tracheal deviation present. No thyromegaly present.  Cardiovascular: Normal rate, regular rhythm, normal heart sounds and intact distal pulses.   Pulmonary/Chest: Effort normal and breath sounds normal. No respiratory distress.  Abdominal: Soft. Bowel sounds are normal. She exhibits no distension. There is no tenderness.  Musculoskeletal: Normal range of motion. She exhibits no edema and no tenderness.  Neurological: She is alert. No cranial nerve deficit.  Oriented to person, place  Skin: Skin is warm and dry.    Labs reviewed: Basic Metabolic Panel:  Recent Labs  29/56/21 1313  NA 138  K 4.4  CL 100  CO2 27  GLUCOSE 93  BUN 16  CREATININE 1.08  CALCIUM 10.2    Liver Function Tests:  Recent Labs  09/17/12 1313  AST 12  ALT 6  ALKPHOS 84  BILITOT 0.5  PROT 7.3  ALBUMIN 3.8    Recent Labs  09/17/12 1313  LIPASE 42  CBC:  Recent Labs  09/17/12 1313  WBC 4.5  NEUTROABS 2.0  HGB 12.6  HCT 37.4  MCV 85.0  PLT 254   Assessment/Plan 1. Vascular dementia Controlling vascular risks,  no recent changes aside from one fall - Lipid panel; Future - Misc. Devices (ROLLATOR) MISC; Patient needs for unsteady gait, h/o fall, dementia  Dispense: 1 each; Refill: 0  2. Hyperlipidemia LDL goal < 100 -stable - Lipid panel; Future  3. Coronary atherosclerosis of native coronary artery -no recent problems -on medical mgt of bp, lipids  - CBC with Differential; Future - Basic metabolic panel; Future - Lipid panel; Future  4. Gout, unspecified -stable, no recent symptoms - Uric Acid; Future  5. Osteopenia -cont vit D 2000 units daily - Basic metabolic panel; Future  6. Fall at home, subsequent encounter - encouraged her to use her walker -new script provided Misc. Devices (ROLLATOR) MISC; Patient needs for unsteady gait, h/o fall, dementia  Dispense: 1 each; Refill: 0  7.  Previous shingles with postherpetic neuralgia - using gabapentin for this and applying steroid cream which relieves her itching per daughter  Labs/tests ordered:  Cbc, bmp, uric acid, lipid panel next visit Next appt:  3 mos

## 2013-05-03 ENCOUNTER — Other Ambulatory Visit: Payer: Medicare Other

## 2013-05-03 DIAGNOSIS — M109 Gout, unspecified: Secondary | ICD-10-CM | POA: Diagnosis not present

## 2013-05-03 DIAGNOSIS — E785 Hyperlipidemia, unspecified: Secondary | ICD-10-CM | POA: Diagnosis not present

## 2013-05-03 DIAGNOSIS — F0150 Vascular dementia without behavioral disturbance: Secondary | ICD-10-CM | POA: Diagnosis not present

## 2013-05-03 DIAGNOSIS — I2510 Atherosclerotic heart disease of native coronary artery without angina pectoris: Secondary | ICD-10-CM | POA: Diagnosis not present

## 2013-05-03 DIAGNOSIS — M899 Disorder of bone, unspecified: Secondary | ICD-10-CM | POA: Diagnosis not present

## 2013-05-03 DIAGNOSIS — M8580 Other specified disorders of bone density and structure, unspecified site: Secondary | ICD-10-CM

## 2013-05-04 LAB — CBC WITH DIFFERENTIAL/PLATELET
Basophils Absolute: 0 10*3/uL (ref 0.0–0.2)
Basos: 1 % (ref 0–3)
Eos: 6 % — ABNORMAL HIGH (ref 0–5)
Eosinophils Absolute: 0.2 10*3/uL (ref 0.0–0.4)
HCT: 34.7 % (ref 34.0–46.6)
Hemoglobin: 11.4 g/dL (ref 11.1–15.9)
Immature Grans (Abs): 0 10*3/uL (ref 0.0–0.1)
Immature Granulocytes: 0 % (ref 0–2)
Lymphocytes Absolute: 1.6 10*3/uL (ref 0.7–3.1)
Lymphs: 42 % (ref 14–46)
MCH: 29.6 pg (ref 26.6–33.0)
MCHC: 32.9 g/dL (ref 31.5–35.7)
MCV: 90 fL (ref 79–97)
Monocytes Absolute: 0.3 10*3/uL (ref 0.1–0.9)
Monocytes: 8 % (ref 4–12)
Neutrophils Absolute: 1.6 10*3/uL (ref 1.4–7.0)
Neutrophils Relative %: 43 % (ref 40–74)
RBC: 3.85 x10E6/uL (ref 3.77–5.28)
RDW: 14.6 % (ref 12.3–15.4)
WBC: 3.8 10*3/uL (ref 3.4–10.8)

## 2013-05-04 LAB — LIPID PANEL
Chol/HDL Ratio: 2 ratio units (ref 0.0–4.4)
Cholesterol, Total: 177 mg/dL (ref 100–199)
HDL: 89 mg/dL (ref >39)
LDL Calculated: 73 mg/dL (ref 0–99)
Triglycerides: 76 mg/dL (ref 0–149)
VLDL Cholesterol Cal: 15 mg/dL (ref 5–40)

## 2013-05-04 LAB — BASIC METABOLIC PANEL
BUN/Creatinine Ratio: 13 (ref 11–26)
BUN: 15 mg/dL (ref 8–27)
CO2: 26 mmol/L (ref 18–29)
Calcium: 9.9 mg/dL (ref 8.6–10.2)
Chloride: 104 mmol/L (ref 97–108)
Creatinine, Ser: 1.19 mg/dL — ABNORMAL HIGH (ref 0.57–1.00)
GFR calc Af Amer: 47 mL/min/{1.73_m2} — ABNORMAL LOW (ref >59)
GFR calc non Af Amer: 41 mL/min/{1.73_m2} — ABNORMAL LOW (ref >59)
Glucose: 87 mg/dL (ref 65–99)
Potassium: 4.2 mmol/L (ref 3.5–5.2)
Sodium: 144 mmol/L (ref 134–144)

## 2013-05-04 LAB — URIC ACID: Uric Acid: 5.7 mg/dL (ref 2.5–7.1)

## 2013-05-19 ENCOUNTER — Other Ambulatory Visit: Payer: Self-pay | Admitting: *Deleted

## 2013-05-24 ENCOUNTER — Other Ambulatory Visit: Payer: Self-pay | Admitting: *Deleted

## 2013-05-24 MED ORDER — RANOLAZINE ER 500 MG PO TB12: ORAL_TABLET | ORAL | Status: DC

## 2013-05-28 ENCOUNTER — Other Ambulatory Visit: Payer: Self-pay | Admitting: Internal Medicine

## 2013-05-28 NOTE — Telephone Encounter (Signed)
Daughter came into office and wanted a refill on patient's Carbamazepine and Ranexa. We received a Contraindication Interaction from the pharmacy stating there is an interaction with the Simvastatin in the vytorin with this Ranexa. Recommended dose of Simvastatin is max of 20mg  everyday with Ranexa.   Per Dr. Renato Gails:  Discontinue Vytorin and ok to decrease Simvastatin to 20mg . Fill Ranexa and Carbamazepine.  Called pharmacy to refill these medications and they do not have Carbamazepine on file and wanted to know the directions of this and they also stated that they were faxing a prior authorization on the Ranexa. I spoke with Dr. Renato Gails and she stated she wasn't sure if she didn't prescribe it. Said to call patient's daughter and confirm dosage.  Called and [facility] for daughter to return call.

## 2013-06-01 NOTE — Telephone Encounter (Signed)
PA received for Ranexa, last OV and most recent labs printed and attached to form and placed on ledge for PCP- Dr.Reed to review and complete.

## 2013-06-02 ENCOUNTER — Other Ambulatory Visit: Payer: Self-pay | Admitting: *Deleted

## 2013-06-02 MED ORDER — CARBAMAZEPINE ER 100 MG PO TB12: ORAL_TABLET | ORAL | Status: DC

## 2013-06-04 NOTE — Telephone Encounter (Signed)
Received letter stating [employer] approved Ranexa approved coverage through September 08, 2013.

## 2013-06-07 NOTE — Telephone Encounter (Signed)
Noted in patient's chart.

## 2013-06-11 ENCOUNTER — Other Ambulatory Visit: Payer: Self-pay | Admitting: *Deleted

## 2013-06-11 MED ORDER — CLOPIDOGREL BISULFATE 75 MG PO TABS: 75.0000 mg | ORAL_TABLET | Freq: Every day | ORAL | Status: DC

## 2013-07-05 ENCOUNTER — Ambulatory Visit (INDEPENDENT_AMBULATORY_CARE_PROVIDER_SITE_OTHER): Payer: Medicare Other | Admitting: Podiatry

## 2013-07-05 ENCOUNTER — Encounter: Payer: Self-pay | Admitting: Podiatry

## 2013-07-05 VITALS — BP 185/105 | HR 80 | Resp 20 | Ht 61.0 in | Wt 145.0 lb

## 2013-07-05 DIAGNOSIS — L84 Corns and callosities: Secondary | ICD-10-CM | POA: Diagnosis not present

## 2013-07-05 DIAGNOSIS — B351 Tinea unguium: Secondary | ICD-10-CM | POA: Diagnosis not present

## 2013-07-05 DIAGNOSIS — M79609 Pain in unspecified limb: Secondary | ICD-10-CM | POA: Diagnosis not present

## 2013-07-05 NOTE — Progress Notes (Signed)
Patient ID: Emily Schultz, female   DOB: [birth year], 79 y.o.   MRN: [id number]   Subjective: This 80 year old black female presents with her daughter complaining of painful toenails and the keratoses on the fifth right toe. Her initial presentation was in June of 2013 and she presents occasionally for the above complaints.  Objective: The yellow-brown brittle thickened toenails x10 noted. Keratoses noted in the medial border of fifth right toe.  Assessment: Symptomatic onychomycoses x10 Keratoses x1  Plan: Nails x10 debrided back and keratoses times one debrided back without any bleeding. Reappoint at three-month intervals were at patient's request.  Richard C.Leeanne Deed, DPM

## 2013-07-08 ENCOUNTER — Encounter (HOSPITAL_BASED_OUTPATIENT_CLINIC_OR_DEPARTMENT_OTHER): Payer: Self-pay | Admitting: Emergency Medicine

## 2013-07-08 ENCOUNTER — Emergency Department (HOSPITAL_BASED_OUTPATIENT_CLINIC_OR_DEPARTMENT_OTHER): Payer: Medicare Other

## 2013-07-08 ENCOUNTER — Emergency Department (HOSPITAL_BASED_OUTPATIENT_CLINIC_OR_DEPARTMENT_OTHER)
Admission: EM | Admit: 2013-07-08 | Discharge: 2013-07-08 | Disposition: A | Discharge status: Home / Self Care | Payer: Medicare Other | Attending: Emergency Medicine | Admitting: Emergency Medicine

## 2013-07-08 DIAGNOSIS — E785 Hyperlipidemia, unspecified: Secondary | ICD-10-CM | POA: Diagnosis not present

## 2013-07-08 DIAGNOSIS — I10 Essential (primary) hypertension: Secondary | ICD-10-CM | POA: Diagnosis not present

## 2013-07-08 DIAGNOSIS — Z8669 Personal history of other diseases of the nervous system and sense organs: Secondary | ICD-10-CM | POA: Diagnosis not present

## 2013-07-08 DIAGNOSIS — F0150 Vascular dementia without behavioral disturbance: Secondary | ICD-10-CM | POA: Diagnosis not present

## 2013-07-08 DIAGNOSIS — R197 Diarrhea, unspecified: Secondary | ICD-10-CM | POA: Diagnosis not present

## 2013-07-08 DIAGNOSIS — R51 Headache: Secondary | ICD-10-CM | POA: Diagnosis not present

## 2013-07-08 DIAGNOSIS — Z87891 Personal history of nicotine dependence: Secondary | ICD-10-CM | POA: Diagnosis not present

## 2013-07-08 DIAGNOSIS — Z79899 Other long term (current) drug therapy: Secondary | ICD-10-CM | POA: Diagnosis not present

## 2013-07-08 DIAGNOSIS — Z7982 Long term (current) use of aspirin: Secondary | ICD-10-CM | POA: Diagnosis not present

## 2013-07-08 DIAGNOSIS — Z8739 Personal history of other diseases of the musculoskeletal system and connective tissue: Secondary | ICD-10-CM | POA: Diagnosis not present

## 2013-07-08 DIAGNOSIS — Z791 Long term (current) use of non-steroidal anti-inflammatories (NSAID): Secondary | ICD-10-CM | POA: Diagnosis not present

## 2013-07-08 DIAGNOSIS — I2510 Atherosclerotic heart disease of native coronary artery without angina pectoris: Secondary | ICD-10-CM | POA: Diagnosis not present

## 2013-07-08 LAB — URINALYSIS, ROUTINE W REFLEX MICROSCOPIC
Ketones, ur: NEGATIVE mg/dL
Leukocytes, UA: NEGATIVE
Nitrite: NEGATIVE
Specific Gravity, Urine: 1.006 (ref 1.005–1.030)
Urobilinogen, UA: 0.2 mg/dL (ref 0.0–1.0)
pH: 7 (ref 5.0–8.0)

## 2013-07-08 LAB — BASIC METABOLIC PANEL
BUN: 19 mg/dL (ref 6–23)
Calcium: 10.1 mg/dL (ref 8.4–10.5)
Chloride: 104 mEq/L (ref 96–112)
GFR calc Af Amer: 41 mL/min — ABNORMAL LOW (ref >90)
Potassium: 4.4 mEq/L (ref 3.5–5.1)
Sodium: 143 mEq/L (ref 135–145)

## 2013-07-08 LAB — CBC WITH DIFFERENTIAL/PLATELET
Basophils Relative: 1 % (ref 0–1)
Eosinophils Absolute: 0.2 10*3/uL (ref 0.0–0.7)
Eosinophils Relative: 5 % (ref 0–5)
Hemoglobin: 12 g/dL (ref 12.0–15.0)
MCH: 30 pg (ref 26.0–34.0)
MCHC: 34.2 g/dL (ref 30.0–36.0)
Monocytes Relative: 8 % (ref 3–12)
Neutro Abs: 1.5 10*3/uL — ABNORMAL LOW (ref 1.7–7.7)
Neutrophils Relative %: 37 % — ABNORMAL LOW (ref 43–77)
RBC: 4 MIL/uL (ref 3.87–5.11)

## 2013-07-08 MED ORDER — ACETAMINOPHEN 325 MG PO TABS
650.0000 mg | ORAL_TABLET | Freq: Once | ORAL | Status: AC
  Administered 2013-07-08: 650 mg via ORAL
  Filled 2013-07-08: qty 2

## 2013-07-08 MED ORDER — ACETAMINOPHEN 325 MG PO TABS
ORAL_TABLET | ORAL | Status: AC
  Filled 2013-07-08: qty 1

## 2013-07-08 MED ORDER — ACETAMINOPHEN 325 MG PO TABS: 650.0000 mg | ORAL_TABLET | Freq: Once | ORAL | Status: DC

## 2013-07-08 MED ORDER — ACETAMINOPHEN 325 MG PO TABS
ORAL_TABLET | ORAL | Status: AC
  Filled 2013-07-08: qty 2

## 2013-07-08 NOTE — ED Notes (Signed)
MD at bedside. 

## 2013-07-08 NOTE — ED Notes (Signed)
Headache  States b/p is up

## 2013-07-08 NOTE — ED Notes (Signed)
Headache  staTES BP IS UP    SEEN IN OFFICE X 3DAYS AGO

## 2013-07-08 NOTE — ED Provider Notes (Signed)
This chart was scribed for Layla Maw Ward, DO by Ardelia Mems, ED Scribe. This patient was seen in room MH10/MH10 and the patient's care was started at 6:45 PM.  TIME SEEN: 6:45 PM  CHIEF COMPLAINT: Headache  HPI:   HPI Comments: Emily Schultz is a 79 y.o. female with history of chronic headaches, hypertension, CAD, hyperlipidemia who presents to the Emergency Department complaining of an intermittent severe frontal headache, localized to her forehead, described as "sharp and achy" over the past week. Pt states that this headache comes on gradually when present. She states that's she has had similar headaches in the past, last being 7 months ago. She states that she has taken Advil with mild relief. Relative states that pt's headache is due to elevated blood pressure, which she states that she has been tracking for the past week. Relative states that she has recorded pt's blood pressure at 187/116 this week. ED blood pressure is 164/98. Pt states that she normally takes 1 Toprol XL 25 mg daily, and that pt has upped the dose to 2 of these daily over the past week, at the direction of her PCP, Dr. Marden Noble. Patient states that she has an appointment with her PCP tomorrow. Relative also states that pt's BP has been low in the past. Pt denies recent head injuries. Patient states that she takes Aspirin daily, but she is not on any other anticoagulation. Patient denies fever, neck pain, neck stiffness, blurry vision or vision loss, emesis or lateralizing numbness/tingling/weakness.     ROS: See HPI Constitutional: no fever  Eyes: no drainage  ENT: no runny nose   Cardiovascular:  no chest pain  Resp: no SOB  GI: no vomiting GU: + Mild diarrhea, no dysuria Integumentary: no rash  Allergy: no hives  Musculoskeletal: no leg swelling , no neck pain, no neck stiffness Neurological: +headache, no slurred speech, no lateralizing numbness/tingling/weakness ROS otherwise negative  PAST MEDICAL  HISTORY/PAST SURGICAL HISTORY:  Past Medical History  Diagnosis Date  . Hyperlipidemia LDL goal < 100   . Gout, unspecified   . Tension headache   . Unspecified essential hypertension   . Coronary atherosclerosis of native coronary artery   . Pain in joint, site unspecified   . Osteopenia   . Other malaise and fatigue   . Chest pain, unspecified   . Cataract 2010  . Vascular dementia     MEDICATIONS:  Prior to Admission medications   Medication Sig Start Date End Date Taking? Authorizing Provider  acidophilus (RISAQUAD) CAPS Take 1 capsule by mouth daily.      Historical Provider, MD  amLODipine (NORVASC) 5 MG tablet Take 5 mg by mouth daily.      Historical Provider, MD  aspirin EC 81 MG tablet Take 81 mg by mouth at bedtime.      Historical Provider, MD  carbamazepine (TEGRETOL-XR) 100 MG 12 hr tablet Take one tablet twice daily 06/02/13   Tiffany L Reed, DO  celecoxib (CELEBREX) 200 MG capsule Take 200 mg by mouth 2 (two) times daily.    Historical Provider, MD  cholecalciferol (VITAMIN D) 1000 UNITS tablet Take 2,000 Units by mouth daily.     Historical Provider, MD  clopidogrel (PLAVIX) 75 MG tablet Take 1 tablet (75 mg total) by mouth daily. 06/11/13   Tiffany L Reed, DO  donepezil (ARICEPT) 10 MG tablet Take 1 tablet (10 mg total) by mouth at bedtime. 04/22/13   Tiffany L Reed, DO  donepezil (ARICEPT) 5 MG  tablet Take 5 mg by mouth at bedtime.    Historical Provider, MD  ezetimibe-simvastatin (VYTORIN) 10-40 MG per tablet Take 1 tablet by mouth at bedtime.    Historical Provider, MD  fish oil-omega-3 fatty acids 1000 MG capsule Take 1,000 mg by mouth daily. Take 1 tablet daily for cholesterol.    Historical Provider, MD  Lactobacillus Rhamnosus, GG, (CULTURELLE) CAPS Take by mouth. Take 1 capsule daily.    Historical Provider, MD  loratadine (CLARITIN) 10 MG tablet Take 10 mg by mouth daily.      Historical Provider, MD  memantine (NAMENDA) 10 MG tablet Take 10 mg by mouth 2 (two)  times daily. Take 1 tablet by mouth twice daily for memory.    Historical Provider, MD  metoprolol succinate (TOPROL-XL) 25 MG 24 hr tablet Take 1 tablet (25 mg total) by mouth daily. When sbp is > 150 04/22/13   Tiffany L Reed, DO  Misc. Devices (ROLLATOR) MISC Patient needs for unsteady gait, h/o fall, dementia 04/26/13   Tiffany L Reed, DO  nitroGLYCERIN (NITROSTAT) 0.4 MG SL tablet Place 0.4 mg under the tongue every 5 (five) minutes as needed for chest pain. Place one under tongue at onset of chest pain. Repeat every 5 minutes up to twice more if needed.    Historical Provider, MD  promethazine (PHENERGAN) 25 MG suppository Place 25 mg rectally every 6 (six) hours as needed for nausea.    Historical Provider, MD  RANEXA 500 MG 12 hr tablet Take one tablet by mouth twice daily 05/28/13   Claudie Revering, NP  ranitidine (ZANTAC) 300 MG tablet TAKE ONE TABLET BY MOUTH DAILY TO REDUCE STOMACH ACID 04/16/13   Oneal Grout, MD  TraMADol HCl 50 MG TBDP Take 50 mg by mouth 3 (three) times daily. Take 1 tablet by mouth three times daily as needed for severe pain.    Historical Provider, MD  triamterene-hydrochlorothiazide (DYAZIDE) 37.5-25 MG per capsule Take 1 capsule by mouth every morning. Take 1 tablet once daily for blood pressure.    Historical Provider, MD    ALLERGIES:  No Known Allergies  SOCIAL HISTORY:  History  Substance Use Topics  . Smoking status: Former Games developer  . Smokeless tobacco: Not on file  . Alcohol Use: No    FAMILY HISTORY: No family history on file.  EXAM: BP 164/98  Pulse 97  Temp(Src) 98.7 F (37.1 C) (Oral)  Resp 16  Ht 5\' 2"  (1.575 m)  Wt 142 lb (64.411 kg)  BMI 25.97 kg/m2  SpO2 99% CONSTITUTIONAL: Alert and oriented and responds appropriately to questions. Well-appearing; well-nourished HEAD: Normocephalic EYES: Conjunctivae clear, PERRL ENT: normal nose; no rhinorrhea; moist mucous membranes; pharynx without lesions noted NECK: Supple, no meningismus, no  LAD  CARD: RRR; S1 and S2 appreciated; no murmurs, no clicks, no rubs, no gallops RESP: Normal chest excursion without splinting or tachypnea; breath sounds clear and equal bilaterally; no wheezes, no rhonchi, no rales,  ABD/GI: Normal bowel sounds; non-distended; soft, non-tender, no rebound, no guarding BACK:  The back appears normal and is non-tender to palpation, there is no CVA tenderness EXT: Normal ROM in all joints; non-tender to palpation; no edema; normal capillary refill; no cyanosis    SKIN: Normal color for age and race; warm NEURO: Moves all extremities equally; strength 5/5 in all 4 extremities; sensation to light touch intact diffusely, cranial nerves II through XII intact PSYCH: The patient's mood and manner are appropriate. Grooming and personal hygiene are appropriate.  MEDICAL DECISION MAKING: Patient with gradual onset, mild frontal headache that she has had in the past. She is also having intermittent elevated blood pressure but her blood pressure today is in the 160s/90s. Family reports that they have increased her Toprol to 50 mg once daily. Family also reports that patient has an issue with hypotension.  Given pt has a h/o fluctuating BP, I do not feel adjusting her BP meds in the ED is appropriate.  Pt has follow up with her PCP tomorrow.  Will obtain basic labs, urine to evaluate for any signs of end organ damage as well as head CT.  ED PROGRESS: Patient reports her headache is almost completely gone after Tylenol. Her labs are unremarkable. She does have a slightly elevated kidney function I have recommended avoiding Advil. She has had an elevated creatinine in the past. Urinalysis shows no sign of infection. Her head CT shows no intracranial hemorrhage but there are probable small lacunar infarcts in the right posterior parietal region and left posterior parietal region. These are of indeterminate age. She has no neurologic deficits on exam. Have recommended to the family  that they followup with their primary care physician for this and she may need an MRI and further risk management. Have offered admission but family would like discharge home to follow up their primary care physician tomorrow. We'll have them continue her aspirin 81 mg daily. Given strict return precautions. Patient and daughter verbalize understanding and are comfortable with plan.     Results for orders placed during the hospital encounter of 07/08/13  CBC WITH DIFFERENTIAL      Result Value Range   WBC 4.0  4.0 - 10.5 K/uL   RBC 4.00  3.87 - 5.11 MIL/uL   Hemoglobin 12.0  12.0 - 15.0 g/dL   HCT 16.1 (*) 09.6 - 04.5 %   MCV 87.8  78.0 - 100.0 fL   MCH 30.0  26.0 - 34.0 pg   MCHC 34.2  30.0 - 36.0 g/dL   RDW 40.9  81.1 - 91.4 %   Platelets 194  150 - 400 K/uL   Neutrophils Relative % 37 (*) 43 - 77 %   Neutro Abs 1.5 (*) 1.7 - 7.7 K/uL   Lymphocytes Relative 50 (*) 12 - 46 %   Lymphs Abs 2.0  0.7 - 4.0 K/uL   Monocytes Relative 8  3 - 12 %   Monocytes Absolute 0.3  0.1 - 1.0 K/uL   Eosinophils Relative 5  0 - 5 %   Eosinophils Absolute 0.2  0.0 - 0.7 K/uL   Basophils Relative 1  0 - 1 %   Basophils Absolute 0.0  0.0 - 0.1 K/uL  BASIC METABOLIC PANEL      Result Value Range   Sodium 143  135 - 145 mEq/L   Potassium 4.4  3.5 - 5.1 mEq/L   Chloride 104  96 - 112 mEq/L   CO2 28  19 - 32 mEq/L   Glucose, Bld 93  70 - 99 mg/dL   BUN 19  6 - 23 mg/dL   Creatinine, Ser 7.82 (*) 0.50 - 1.10 mg/dL   Calcium 95.6  8.4 - 21.3 mg/dL   GFR calc non Af Amer 35 (*) >90 mL/min   GFR calc Af Amer 41 (*) >90 mL/min  URINALYSIS, ROUTINE W REFLEX MICROSCOPIC      Result Value Range   Color, Urine YELLOW  YELLOW   APPearance CLEAR  CLEAR   Specific  Gravity, Urine 1.006  1.005 - 1.030   pH 7.0  5.0 - 8.0   Glucose, UA NEGATIVE  NEGATIVE mg/dL   Hgb urine dipstick NEGATIVE  NEGATIVE   Bilirubin Urine NEGATIVE  NEGATIVE   Ketones, ur NEGATIVE  NEGATIVE mg/dL   Protein, ur NEGATIVE  NEGATIVE  mg/dL   Urobilinogen, UA 0.2  0.0 - 1.0 mg/dL   Nitrite NEGATIVE  NEGATIVE   Leukocytes, UA NEGATIVE  NEGATIVE   Ct Head Wo Contrast  07/08/2013   CLINICAL DATA:  Headache, hypertension  EXAM: CT HEAD WITHOUT CONTRAST  TECHNIQUE: Contiguous axial images were obtained from the base of the skull through the vertex without intravenous contrast.  COMPARISON:  None.  FINDINGS: No skull fracture is noted. Paranasal sinuses and mastoid air cells are unremarkable. Mild cerebral atrophy. No intracranial hemorrhage, mass effect or midline shift. No definite acute cortical infarction. There is probable lacunar infarct in right posterior parietal region. Probable small lacunar infarct in left posterior parietal region. These are of indeterminate age. Further evaluation with MRI could be performed as clinically warranted.  No mass lesion is noted on this unenhanced scan.  IMPRESSION: No intracranial hemorrhage, mass effect or midline shift. No definite acute cortical infarction. There is probable lacunar infarct in right posterior parietal region. Probable small lacunar infarct in left posterior parietal region. These are of indeterminate age. Further evaluation with MRI could be performed as clinically warranted.   Electronically Signed   By: Natasha Mead M.D.   On: 07/08/2013 19:36       Layla Maw Ward, DO 07/08/13 2010

## 2013-07-09 ENCOUNTER — Other Ambulatory Visit: Payer: Self-pay | Admitting: Internal Medicine

## 2013-07-09 DIAGNOSIS — I10 Essential (primary) hypertension: Secondary | ICD-10-CM | POA: Diagnosis not present

## 2013-07-09 DIAGNOSIS — L219 Seborrheic dermatitis, unspecified: Secondary | ICD-10-CM | POA: Diagnosis not present

## 2013-07-26 DIAGNOSIS — L219 Seborrheic dermatitis, unspecified: Secondary | ICD-10-CM | POA: Diagnosis not present

## 2013-07-26 DIAGNOSIS — G253 Myoclonus: Secondary | ICD-10-CM | POA: Diagnosis not present

## 2013-07-26 DIAGNOSIS — I10 Essential (primary) hypertension: Secondary | ICD-10-CM | POA: Diagnosis not present

## 2013-08-09 DIAGNOSIS — B0229 Other postherpetic nervous system involvement: Secondary | ICD-10-CM | POA: Diagnosis not present

## 2013-08-09 DIAGNOSIS — L219 Seborrheic dermatitis, unspecified: Secondary | ICD-10-CM | POA: Diagnosis not present

## 2013-08-09 DIAGNOSIS — G253 Myoclonus: Secondary | ICD-10-CM | POA: Diagnosis not present

## 2013-08-09 DIAGNOSIS — M19019 Primary osteoarthritis, unspecified shoulder: Secondary | ICD-10-CM | POA: Diagnosis not present

## 2013-08-09 DIAGNOSIS — I2510 Atherosclerotic heart disease of native coronary artery without angina pectoris: Secondary | ICD-10-CM | POA: Diagnosis not present

## 2013-08-09 DIAGNOSIS — I10 Essential (primary) hypertension: Secondary | ICD-10-CM | POA: Diagnosis not present

## 2013-08-13 ENCOUNTER — Other Ambulatory Visit: Payer: Self-pay | Admitting: *Deleted

## 2013-08-23 ENCOUNTER — Other Ambulatory Visit: Payer: Self-pay | Admitting: *Deleted

## 2013-08-24 ENCOUNTER — Telehealth: Payer: Self-pay | Admitting: *Deleted

## 2013-08-24 NOTE — Telephone Encounter (Signed)
Patient's daughter stopped by and wants to know if patient can go back on Protonix 40mg  One tablet once daily. Due to her having stomach issues again. Please Advise [facility] #: [phone]

## 2013-08-24 NOTE — Telephone Encounter (Signed)
Pt has not been seen for several months.  She should have a f/u appt.

## 2013-08-25 NOTE — Telephone Encounter (Signed)
LMOM that patient needs an appointment

## 2013-09-27 ENCOUNTER — Encounter: Payer: Self-pay | Admitting: Podiatry

## 2013-09-27 ENCOUNTER — Ambulatory Visit (INDEPENDENT_AMBULATORY_CARE_PROVIDER_SITE_OTHER): Payer: Medicare Other | Admitting: Podiatry

## 2013-09-27 VITALS — BP 148/88 | HR 72 | Resp 12

## 2013-09-27 DIAGNOSIS — M79609 Pain in unspecified limb: Secondary | ICD-10-CM

## 2013-09-27 DIAGNOSIS — L84 Corns and callosities: Secondary | ICD-10-CM

## 2013-09-27 DIAGNOSIS — B351 Tinea unguium: Secondary | ICD-10-CM

## 2013-09-28 NOTE — Progress Notes (Signed)
Patient ID: Emily Schultz, female   DOB: 10-Jun-1922, 80 y.o.   MRN: [id number]  Subjective: Orientated x36 80 year old black female request debridement of painful toenails and keratoses.The last visit for this service was 03/22/2013. She has been a patient in this practice since 02/20/2012.  Objective: Dermatological: Hypertrophic, discolored, elongated toenails x10. Keratoses fourth right webspace.  Assessment: Symptomatic onychomycoses x10 Keratoses x1  Plan: Nails x10 and keratoses x1 debrided without a bleeding. Reappoint at three-month intervals.

## 2013-10-12 ENCOUNTER — Other Ambulatory Visit: Payer: Self-pay | Admitting: Internal Medicine

## 2013-10-17 ENCOUNTER — Inpatient Hospital Stay (HOSPITAL_COMMUNITY)
Admission: EM | Admit: 2013-10-17 | Discharge: 2013-10-19 | DRG: 312 | Disposition: A | Discharge status: Home / Self Care | Payer: Medicare Other | Attending: Internal Medicine | Admitting: Internal Medicine

## 2013-10-17 ENCOUNTER — Emergency Department (HOSPITAL_COMMUNITY): Payer: Medicare Other

## 2013-10-17 ENCOUNTER — Encounter (HOSPITAL_COMMUNITY): Payer: Self-pay | Admitting: Emergency Medicine

## 2013-10-17 DIAGNOSIS — N189 Chronic kidney disease, unspecified: Secondary | ICD-10-CM | POA: Diagnosis present

## 2013-10-17 DIAGNOSIS — I519 Heart disease, unspecified: Secondary | ICD-10-CM | POA: Diagnosis not present

## 2013-10-17 DIAGNOSIS — R918 Other nonspecific abnormal finding of lung field: Secondary | ICD-10-CM | POA: Diagnosis not present

## 2013-10-17 DIAGNOSIS — R569 Unspecified convulsions: Secondary | ICD-10-CM | POA: Diagnosis not present

## 2013-10-17 DIAGNOSIS — I2510 Atherosclerotic heart disease of native coronary artery without angina pectoris: Secondary | ICD-10-CM | POA: Diagnosis not present

## 2013-10-17 DIAGNOSIS — F0150 Vascular dementia without behavioral disturbance: Secondary | ICD-10-CM | POA: Diagnosis not present

## 2013-10-17 DIAGNOSIS — F0390 Unspecified dementia without behavioral disturbance: Secondary | ICD-10-CM | POA: Diagnosis not present

## 2013-10-17 DIAGNOSIS — R5381 Other malaise: Secondary | ICD-10-CM | POA: Diagnosis not present

## 2013-10-17 DIAGNOSIS — K5900 Constipation, unspecified: Secondary | ICD-10-CM | POA: Diagnosis not present

## 2013-10-17 DIAGNOSIS — M109 Gout, unspecified: Secondary | ICD-10-CM | POA: Diagnosis present

## 2013-10-17 DIAGNOSIS — I10 Essential (primary) hypertension: Secondary | ICD-10-CM | POA: Diagnosis not present

## 2013-10-17 DIAGNOSIS — I359 Nonrheumatic aortic valve disorder, unspecified: Secondary | ICD-10-CM | POA: Diagnosis not present

## 2013-10-17 DIAGNOSIS — R404 Transient alteration of awareness: Secondary | ICD-10-CM | POA: Diagnosis not present

## 2013-10-17 DIAGNOSIS — Z87891 Personal history of nicotine dependence: Secondary | ICD-10-CM | POA: Diagnosis not present

## 2013-10-17 DIAGNOSIS — I498 Other specified cardiac arrhythmias: Secondary | ICD-10-CM | POA: Diagnosis not present

## 2013-10-17 DIAGNOSIS — Z7982 Long term (current) use of aspirin: Secondary | ICD-10-CM | POA: Diagnosis not present

## 2013-10-17 DIAGNOSIS — I672 Cerebral atherosclerosis: Secondary | ICD-10-CM | POA: Diagnosis present

## 2013-10-17 DIAGNOSIS — I469 Cardiac arrest, cause unspecified: Secondary | ICD-10-CM | POA: Diagnosis not present

## 2013-10-17 DIAGNOSIS — R55 Syncope and collapse: Secondary | ICD-10-CM | POA: Diagnosis not present

## 2013-10-17 DIAGNOSIS — E785 Hyperlipidemia, unspecified: Secondary | ICD-10-CM | POA: Diagnosis present

## 2013-10-17 DIAGNOSIS — I129 Hypertensive chronic kidney disease with stage 1 through stage 4 chronic kidney disease, or unspecified chronic kidney disease: Secondary | ICD-10-CM | POA: Diagnosis present

## 2013-10-17 DIAGNOSIS — Z7902 Long term (current) use of antithrombotics/antiplatelets: Secondary | ICD-10-CM | POA: Diagnosis not present

## 2013-10-17 DIAGNOSIS — N179 Acute kidney failure, unspecified: Secondary | ICD-10-CM | POA: Diagnosis present

## 2013-10-17 DIAGNOSIS — I350 Nonrheumatic aortic (valve) stenosis: Secondary | ICD-10-CM | POA: Diagnosis present

## 2013-10-17 DIAGNOSIS — R519 Headache, unspecified: Secondary | ICD-10-CM

## 2013-10-17 DIAGNOSIS — R51 Headache: Secondary | ICD-10-CM

## 2013-10-17 DIAGNOSIS — Z87898 Personal history of other specified conditions: Secondary | ICD-10-CM

## 2013-10-17 DIAGNOSIS — R5383 Other fatigue: Secondary | ICD-10-CM | POA: Diagnosis not present

## 2013-10-17 LAB — CBC WITH DIFFERENTIAL/PLATELET
BASOS ABS: 0 10*3/uL (ref 0.0–0.1)
BASOS PCT: 1 % (ref 0–1)
EOS ABS: 0.1 10*3/uL (ref 0.0–0.7)
EOS PCT: 2 % (ref 0–5)
HEMATOCRIT: 37.9 % (ref 36.0–46.0)
Hemoglobin: 12.8 g/dL (ref 12.0–15.0)
LYMPHS PCT: 42 % (ref 12–46)
Lymphs Abs: 2 10*3/uL (ref 0.7–4.0)
MCH: 29.8 pg (ref 26.0–34.0)
MCHC: 33.8 g/dL (ref 30.0–36.0)
MCV: 88.1 fL (ref 78.0–100.0)
MONO ABS: 0.3 10*3/uL (ref 0.1–1.0)
Monocytes Relative: 6 % (ref 3–12)
Neutro Abs: 2.3 10*3/uL (ref 1.7–7.7)
Neutrophils Relative %: 49 % (ref 43–77)
Platelets: 200 10*3/uL (ref 150–400)
RBC: 4.3 MIL/uL (ref 3.87–5.11)
RDW: 14.4 % (ref 11.5–15.5)
WBC: 4.6 10*3/uL (ref 4.0–10.5)

## 2013-10-17 LAB — COMPREHENSIVE METABOLIC PANEL
ALT: 7 U/L (ref 0–35)
AST: 13 U/L (ref 0–37)
Albumin: 4 g/dL (ref 3.5–5.2)
Alkaline Phosphatase: 91 U/L (ref 39–117)
BUN: 23 mg/dL (ref 6–23)
CALCIUM: 9.6 mg/dL (ref 8.4–10.5)
CO2: 24 mEq/L (ref 19–32)
CREATININE: 1.83 mg/dL — AB (ref 0.50–1.10)
Chloride: 98 mEq/L (ref 96–112)
GFR calc Af Amer: 27 mL/min — ABNORMAL LOW (ref >90)
GFR, EST NON AFRICAN AMERICAN: 23 mL/min — AB (ref >90)
Glucose, Bld: 111 mg/dL — ABNORMAL HIGH (ref 70–99)
Potassium: 3.7 mEq/L (ref 3.7–5.3)
Sodium: 138 mEq/L (ref 137–147)
TOTAL PROTEIN: 7.7 g/dL (ref 6.0–8.3)
Total Bilirubin: 0.2 mg/dL — ABNORMAL LOW (ref 0.3–1.2)

## 2013-10-17 LAB — APTT: aPTT: 25 seconds (ref 24–37)

## 2013-10-17 LAB — POCT I-STAT TROPONIN I: TROPONIN I, POC: 0.01 ng/mL (ref 0.00–0.08)

## 2013-10-17 LAB — PROTIME-INR
INR: 0.98 (ref 0.00–1.49)
PROTHROMBIN TIME: 12.8 s (ref 11.6–15.2)

## 2013-10-17 LAB — CG4 I-STAT (LACTIC ACID): Lactic Acid, Venous: 1.43 mmol/L (ref 0.5–2.2)

## 2013-10-17 LAB — MAGNESIUM: Magnesium: 2 mg/dL (ref 1.5–2.5)

## 2013-10-17 LAB — TROPONIN I: Troponin I: <0.3 ng/mL (ref <0.30)

## 2013-10-17 LAB — CARBAMAZEPINE LEVEL, TOTAL: Carbamazepine Lvl: 5.4 ug/mL (ref 4.0–12.0)

## 2013-10-17 MED ORDER — ASPIRIN EC 81 MG PO TBEC
81.0000 mg | DELAYED_RELEASE_TABLET | Freq: Every day | ORAL | Status: DC
  Administered 2013-10-18: 81 mg via ORAL
  Filled 2013-10-17 (×3): qty 1

## 2013-10-17 MED ORDER — OMEGA-3-ACID ETHYL ESTERS 1 G PO CAPS
1.0000 g | ORAL_CAPSULE | Freq: Two times a day (BID) | ORAL | Status: DC
  Administered 2013-10-17 – 2013-10-18 (×3): 1 g via ORAL
  Filled 2013-10-17 (×5): qty 1

## 2013-10-17 MED ORDER — CULTURELLE PO CAPS: 1.0000 | ORAL_CAPSULE | Freq: Every day | ORAL | Status: DC

## 2013-10-17 MED ORDER — SACCHAROMYCES BOULARDII 250 MG PO CAPS
250.0000 mg | ORAL_CAPSULE | Freq: Every day | ORAL | Status: DC
  Administered 2013-10-18: 250 mg via ORAL
  Filled 2013-10-17 (×2): qty 1

## 2013-10-17 MED ORDER — CARBAMAZEPINE ER 100 MG PO TB12
100.0000 mg | ORAL_TABLET | Freq: Two times a day (BID) | ORAL | Status: DC
  Administered 2013-10-17 – 2013-10-18 (×3): 100 mg via ORAL
  Filled 2013-10-17 (×5): qty 1

## 2013-10-17 MED ORDER — METOPROLOL SUCCINATE ER 25 MG PO TB24
25.0000 mg | ORAL_TABLET | Freq: Every day | ORAL | Status: DC
  Administered 2013-10-18: 25 mg via ORAL
  Filled 2013-10-17 (×2): qty 1

## 2013-10-17 MED ORDER — ONDANSETRON HCL 4 MG/2ML IJ SOLN
INTRAMUSCULAR | Status: AC
  Administered 2013-10-17: 4 mg
  Filled 2013-10-17: qty 2

## 2013-10-17 MED ORDER — CLOPIDOGREL BISULFATE 75 MG PO TABS
75.0000 mg | ORAL_TABLET | Freq: Every day | ORAL | Status: DC
  Administered 2013-10-18: 75 mg via ORAL
  Filled 2013-10-17: qty 1

## 2013-10-17 MED ORDER — ONDANSETRON HCL 4 MG/2ML IJ SOLN
4.0000 mg | Freq: Four times a day (QID) | INTRAMUSCULAR | Status: DC | PRN
  Administered 2013-10-18: 4 mg via INTRAVENOUS
  Filled 2013-10-17: qty 2

## 2013-10-17 MED ORDER — PANTOPRAZOLE SODIUM 40 MG PO TBEC: 40.0000 mg | DELAYED_RELEASE_TABLET | Freq: Every day | ORAL | Status: DC

## 2013-10-17 MED ORDER — LOSARTAN POTASSIUM 50 MG PO TABS
50.0000 mg | ORAL_TABLET | Freq: Every day | ORAL | Status: DC
  Administered 2013-10-18: 50 mg via ORAL
  Filled 2013-10-17 (×2): qty 1

## 2013-10-17 MED ORDER — SIMVASTATIN 20 MG PO TABS
20.0000 mg | ORAL_TABLET | Freq: Every day | ORAL | Status: DC
  Administered 2013-10-18: 20 mg via ORAL
  Filled 2013-10-17 (×3): qty 1

## 2013-10-17 MED ORDER — HYDROCODONE-ACETAMINOPHEN 5-325 MG PO TABS: 1.0000 | ORAL_TABLET | Freq: Four times a day (QID) | ORAL | Status: DC | PRN

## 2013-10-17 MED ORDER — ONDANSETRON HCL 4 MG PO TABS: 4.0000 mg | ORAL_TABLET | Freq: Four times a day (QID) | ORAL | Status: DC | PRN

## 2013-10-17 MED ORDER — OMEGA-3 FATTY ACIDS 1000 MG PO CAPS: 1000.0000 mg | ORAL_CAPSULE | Freq: Two times a day (BID) | ORAL | Status: DC

## 2013-10-17 MED ORDER — ACETAMINOPHEN 325 MG PO TABS
650.0000 mg | ORAL_TABLET | Freq: Once | ORAL | Status: AC
  Administered 2013-10-17: 650 mg via ORAL
  Filled 2013-10-17: qty 2

## 2013-10-17 MED ORDER — DONEPEZIL HCL 10 MG PO TABS
10.0000 mg | ORAL_TABLET | Freq: Every day | ORAL | Status: DC
  Administered 2013-10-17 – 2013-10-18 (×2): 10 mg via ORAL
  Filled 2013-10-17 (×3): qty 1

## 2013-10-17 MED ORDER — GABAPENTIN 300 MG PO CAPS
300.0000 mg | ORAL_CAPSULE | Freq: Three times a day (TID) | ORAL | Status: DC
  Administered 2013-10-17 – 2013-10-18 (×4): 300 mg via ORAL
  Filled 2013-10-17 (×7): qty 1

## 2013-10-17 MED ORDER — DOCUSATE SODIUM 100 MG PO CAPS
100.0000 mg | ORAL_CAPSULE | Freq: Once | ORAL | Status: DC
  Filled 2013-10-17: qty 1

## 2013-10-17 MED ORDER — FAMOTIDINE 20 MG PO TABS
20.0000 mg | ORAL_TABLET | Freq: Every day | ORAL | Status: DC
  Administered 2013-10-18: 20 mg via ORAL
  Filled 2013-10-17 (×3): qty 1

## 2013-10-17 MED ORDER — LORATADINE 10 MG PO TABS
10.0000 mg | ORAL_TABLET | Freq: Every day | ORAL | Status: DC | PRN
  Filled 2013-10-17: qty 1

## 2013-10-17 MED ORDER — ENOXAPARIN SODIUM 40 MG/0.4ML ~~LOC~~ SOLN
40.0000 mg | SUBCUTANEOUS | Status: DC
Start: 2013-10-17 — End: 2013-10-17
  Filled 2013-10-17: qty 0.4

## 2013-10-17 MED ORDER — NITROGLYCERIN 0.4 MG SL SUBL: 0.4000 mg | SUBLINGUAL_TABLET | SUBLINGUAL | Status: DC | PRN

## 2013-10-17 MED ORDER — SODIUM CHLORIDE 0.9 % IJ SOLN
3.0000 mL | Freq: Two times a day (BID) | INTRAMUSCULAR | Status: DC
Start: 2013-10-17 — End: 2013-10-19
  Administered 2013-10-17 – 2013-10-18 (×3): 3 mL via INTRAVENOUS

## 2013-10-17 MED ORDER — CARBAMAZEPINE ER 100 MG PO CP12: 100.0000 mg | ORAL_CAPSULE | Freq: Two times a day (BID) | ORAL | Status: DC

## 2013-10-17 MED ORDER — ACETAMINOPHEN 650 MG RE SUPP: 650.0000 mg | Freq: Four times a day (QID) | RECTAL | Status: DC | PRN

## 2013-10-17 MED ORDER — ACETAMINOPHEN 325 MG PO TABS
650.0000 mg | ORAL_TABLET | Freq: Four times a day (QID) | ORAL | Status: DC | PRN
Start: 2013-10-17 — End: 2013-10-19

## 2013-10-17 MED ORDER — AMLODIPINE BESYLATE 5 MG PO TABS
5.0000 mg | ORAL_TABLET | Freq: Every day | ORAL | Status: DC
  Administered 2013-10-18: 5 mg via ORAL
  Filled 2013-10-17 (×2): qty 1

## 2013-10-17 MED ORDER — RANOLAZINE ER 500 MG PO TB12
500.0000 mg | ORAL_TABLET | Freq: Every day | ORAL | Status: DC
  Administered 2013-10-18: 500 mg via ORAL
  Filled 2013-10-17 (×2): qty 1

## 2013-10-17 MED ORDER — HYDRALAZINE HCL 20 MG/ML IJ SOLN: 10.0000 mg | INTRAMUSCULAR | Status: DC | PRN

## 2013-10-17 MED ORDER — ASPIRIN 81 MG PO CHEW
324.0000 mg | CHEWABLE_TABLET | Freq: Once | ORAL | Status: AC
  Administered 2013-10-17: 324 mg via ORAL
  Filled 2013-10-17: qty 4

## 2013-10-17 MED ORDER — ENOXAPARIN SODIUM 30 MG/0.3ML ~~LOC~~ SOLN
30.0000 mg | SUBCUTANEOUS | Status: DC
  Administered 2013-10-17 – 2013-10-18 (×2): 30 mg via SUBCUTANEOUS
  Filled 2013-10-17 (×3): qty 0.3

## 2013-10-17 MED ORDER — SODIUM CHLORIDE 0.9 % IV SOLN
INTRAVENOUS | Status: AC
  Administered 2013-10-17: via INTRAVENOUS

## 2013-10-17 NOTE — ED Notes (Signed)
Pt knowns that urine is needed

## 2013-10-17 NOTE — Progress Notes (Signed)
ANTICOAGULATION CONSULT NOTE - Initial Consult  Pharmacy Consult for lovenox Indication: VTE prophylaxis  No Known Allergies    Vital Signs: BP: 131/97 mmHg (02/08 2100) Pulse Rate: 51 (02/08 2100)  Labs:  Recent Labs  10/17/13 1635  HGB 12.8  HCT 37.9  PLT 200  APTT 25  LABPROT 12.8  INR 0.98  CREATININE 1.83*    The CrCl is unknown because both a height and weight (above a minimum accepted value) are required for this calculation.   Medical History: Past Medical History  Diagnosis Date  . Hyperlipidemia LDL goal < 100   . Gout, unspecified   . Tension headache   . Unspecified essential hypertension   . Coronary atherosclerosis of native coronary artery   . Pain in joint, site unspecified   . Osteopenia   . Other malaise and fatigue   . Chest pain, unspecified   . Cataract 2010  . Vascular dementia    Assessment: Patient is an 80 y.o F admitted today to the ED with c/o of weakness. To start lovenox for VTE prophylaxis.  Scr 1.83 (crcl~ 24).  CBC ok, INR 0.98   Plan:  1) change lovenox to 30mg  daily for renal function  Shivan Hodes P 10/17/2013,9:52 PM

## 2013-10-17 NOTE — ED Notes (Signed)
PA at bedside.

## 2013-10-17 NOTE — H&P (Signed)
[facility] History and Physical  Emily BogusLiza Habeeb XBJ:[id number] DOB: 30-Jun-1922 DOA: 10/17/2013  Referring physician: ER physician. PCP: Pearla DubonnetGATES,ROBERT NEVILL, MD   Chief Complaint: Loss of consciousness.  HPI: Emily Schultz is a 80 y.o. female with history of dementia, hypertension, CAD, seizures(as per patient's daughter), hyperlipidemia had a syncopal episode today as witnessed by patient's. Patient's daughter was doing patient's hair when suddenly patient eyes rolled up and patient slumped. Patient's daughter is a Engineer, civil (consulting)nurse. Patient was found to be having no pulse and 2 chest compressions were given the patient started. After which patient started to breathe again and patient had pulse. Patient was brought to the ER. CT head was negative for any acute. Cardiac markers have been negative. EKG shows sinus bradycardia. On-call cardiologist was consulted and at this time they have requested medical admission and further workup for syncope. Patient's daughter states that she had a seizure-like episode 2 days ago which lasted for 10-20 minutes. During which patient was awake. Patient otherwise did not have any nausea vomiting abdominal pain shortness of breath diarrhea fever chills.   Review of Systems: As presented in the history of presenting illness, rest negative.  Past Medical History  Diagnosis Date  . Hyperlipidemia LDL goal < 100   . Gout, unspecified   . Tension headache   . Unspecified essential hypertension   . Coronary atherosclerosis of native coronary artery   . Pain in joint, site unspecified   . Osteopenia   . Other malaise and fatigue   . Chest pain, unspecified   . Cataract 2010  . Vascular dementia    Past Surgical History  Procedure Laterality Date  . Spine surgery  [phone]  . Tubal ligation  1960  . Cardiac catheterization     Social History:  reports that she has quit smoking. She does not have any smokeless tobacco history on file. She reports that she does not  drink alcohol or use illicit drugs. Where does patient live  home. Can patient participate in ADLs?  Yes.  No Known Allergies  Family History:  Family History  Problem Relation Age of Onset  . Stroke Mother   . Other Neg Hx       Prior to Admission medications   Medication Sig Start Date End Date Taking? Authorizing Provider  acidophilus (RISAQUAD) CAPS Take 1 capsule by mouth daily.     Yes Historical Provider, MD  Alpha-D-Galactosidase (BEANO PO) Take 2 tablets by mouth daily as needed (gas).   Yes Historical Provider, MD  amLODipine (NORVASC) 5 MG tablet Take 5 mg by mouth daily.     Yes Historical Provider, MD  aspirin EC 81 MG tablet Take 81 mg by mouth at bedtime.     Yes Historical Provider, MD  carbamazepine (CARBATROL) 100 MG 12 hr capsule Take 100 mg by mouth 2 (two) times daily.   Yes Historical Provider, MD  cholecalciferol (VITAMIN D) 1000 UNITS tablet Take 2,000 Units by mouth daily.    Yes Historical Provider, MD  clopidogrel (PLAVIX) 75 MG tablet Take 1 tablet (75 mg total) by mouth daily. 07/09/13  Yes Kimber RelicArthur G Green, MD  donepezil (ARICEPT) 10 MG tablet Take 1 tablet (10 mg total) by mouth at bedtime. 04/22/13  Yes Tiffany L Reed, DO  fish oil-omega-3 fatty acids 1000 MG capsule Take 1,000 mg by mouth 2 (two) times daily.    Yes Historical Provider, MD  gabapentin (NEURONTIN) 300 MG capsule Take 300 mg by mouth 3 (three) times daily.  10/12/13  Yes Historical Provider, MD  HYDROcodone-acetaminophen (NORCO/VICODIN) 5-325 MG per tablet Take 1 tablet by mouth every 6 (six) hours as needed for moderate pain.  09/13/13  Yes Historical Provider, MD  Lactobacillus Rhamnosus, GG, (CULTURELLE) CAPS Take 1 capsule by mouth daily.    Yes Historical Provider, MD  loratadine (CLARITIN) 10 MG tablet Take 10 mg by mouth daily as needed for allergies.    Yes Historical Provider, MD  losartan (COZAAR) 50 MG tablet Take 50 mg by mouth daily.  09/28/13  Yes Historical Provider, MD  metoprolol  succinate (TOPROL-XL) 25 MG 24 hr tablet Take 1 tablet (25 mg total) by mouth daily. When sbp is > 150 04/22/13  Yes Tiffany L Reed, DO  nitroGLYCERIN (NITROSTAT) 0.4 MG SL tablet Place 0.4 mg under the tongue every 5 (five) minutes as needed for chest pain. Place one under tongue at onset of chest pain. Repeat every 5 minutes up to twice more if needed.   Yes Historical Provider, MD  pantoprazole (PROTONIX) 40 MG tablet Take 40 mg by mouth daily.  09/28/13  Yes Historical Provider, MD  ranitidine (ZANTAC) 300 MG tablet Take 300 mg by mouth every evening.   Yes Historical Provider, MD  ranolazine (RANEXA) 500 MG 12 hr tablet Take 500 mg by mouth daily.   Yes Historical Provider, MD  simvastatin (ZOCOR) 20 MG tablet Take 20 mg by mouth at bedtime.  10/12/13  Yes Historical Provider, MD  triamterene-hydrochlorothiazide (DYAZIDE) 37.5-25 MG per capsule Take 1 capsule by mouth every morning. Take 1 tablet once daily for blood pressure.   Yes Historical Provider, MD  Misc. Devices (ROLLATOR) MISC Patient needs for unsteady gait, h/o fall, dementia 04/26/13   Kermit Balo, DO    Physical Exam: Filed Vitals:   10/17/13 2007 10/17/13 2011 10/17/13 2014 10/17/13 2100  BP: 176/107 172/90 203/86 131/97  Pulse: 67 62 67 51  Resp:   14   SpO2:   100% 97%     General:  Well developed and moderately nourished.  Eyes:  Anicteric no pallor.  ENT:  No discharge from the ears eyes nose mouth.  Neck:  No mass felt.  Cardiovascular:  S1-S2 heard.  Respiratory:  No rhonchi or crepitations.  Abdomen:  Soft nontender bowel sounds present. No guarding rigidity.  Skin:  No rash.  Musculoskeletal:  No edema.  Psychiatric:  Appears normal. Patient has dementia.  Neurologic:  Moves all extremities. No facial asymmetry. Tongue is midline. PERRLA positive.  Labs on Admission:  Basic Metabolic Panel:  Recent Labs Lab 10/17/13 1635  NA 138  K 3.7  CL 98  CO2 24  GLUCOSE 111*  BUN 23  CREATININE  1.83*  CALCIUM 9.6  MG 2.0   Liver Function Tests:  Recent Labs Lab 10/17/13 1635  AST 13  ALT 7  ALKPHOS 91  BILITOT 0.2*  PROT 7.7  ALBUMIN 4.0   No results found for this basename: LIPASE, AMYLASE,  in the last 168 hours No results found for this basename: AMMONIA,  in the last 168 hours CBC:  Recent Labs Lab 10/17/13 1635  WBC 4.6  NEUTROABS 2.3  HGB 12.8  HCT 37.9  MCV 88.1  PLT 200   Cardiac Enzymes: No results found for this basename: CKTOTAL, CKMB, CKMBINDEX, TROPONINI,  in the last 168 hours  BNP (last 3 results) No results found for this basename: PROBNP,  in the last 8760 hours CBG: No results found for this basename: GLUCAP,  in the last 168 hours  Radiological Exams on Admission: Ct Head Wo Contrast  10/17/2013   CLINICAL DATA:  Unresponsive.  EXAM: CT HEAD WITHOUT CONTRAST  TECHNIQUE: Contiguous axial images were obtained from the base of the skull through the vertex without intravenous contrast.  COMPARISON:  July 08, 2013  FINDINGS: There is chronic diffuse atrophy. Chronic bilateral periventricular white matter small vessel ischemic changes identified. 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 There is no midline shift, hydrocephalus, or mass. No acute hemorrhage or acute transcortical infarct is identified. The bony calvarium is intact. There is minimal mucoperiosteal thickening of bilateral ethmoid sinuses.  IMPRESSION: No focal acute intracranial abnormality identified. Chronic diffuse atrophy. Chronic bilateral periventricular white matter small vessel ischemic change.   Electronically Signed   By: Sherian Rein M.D.   On: 10/17/2013 19:50   Dg Chest Portable 1 View  10/17/2013   CLINICAL DATA:  Syncopal episode with weakness today. Constipation and headaches for 3 days. History of hypertension and dementia.  EXAM: PORTABLE CHEST - 1 VIEW  COMPARISON:  None.  FINDINGS: There is mild cardiomegaly and aortic atherosclerosis.  The lungs are clear. There is no pleural effusion or pneumothorax. Advanced glenohumeral degenerative changes are present bilaterally with fragmentation and subchondral collapse of both humeral heads.  IMPRESSION: No acute cardiopulmonary process.  Mild cardiomegaly.   Electronically Signed   By: Roxy Horseman M.D.   On: 10/17/2013 17:35    EKG: Independently reviewed.  Sinus bradycardia with nonspecific T-wave changes anterior and lateral leads.  Assessment/Plan Principal Problem:   Syncope Active Problems:   Vascular dementia   Coronary atherosclerosis of native coronary artery   Hyperlipidemia   History of seizure   Renal failure (ARF), acute on chronic   1. Syncope - patient was found to be not orthostatic in the ER. Patient has mild sinus bradycardia. At this time we'll closely monitor in telemetry. I have written holding parameters for metoprolol. If patient's heart rate gets significantly bradycardic then protocol has to be held along with Aricept. Check 2-D echo and given the history of CAD cycle cardiac markers. 2. Acute renal failure on chronic kidney disease - check UA. I'm holding off patient's HCTZ for now. Gently hydrate. If creatinine worsens hold ARB also. 3. History of seizures - I have discussed with on-call neurologist Dr. Amada Jupiter. Dr. Amada Jupiter advised that if patient's carbamazepine level is low then increase the dose. Check carbamazepine level. 4. CAD - denies any chest pain. 5. Hyperlipidemia - continue statins. 6. Dementia - continue present medication.    Code Status:  Full code.  Family Communication:  Patient's daughter at the bedside.  Disposition Plan:  Admit for observation under Dr. Marden Noble.    Marni Franzoni N. [facility] Pager [phone].  If 7PM-7AM, please contact night-coverage [email or web address] Password [email or web address] 10/17/2013, 9:15 PM

## 2013-10-17 NOTE — ED Notes (Signed)
Per family member, pt became unresponsive at home.  Pt was sitting in her chair and her eyes rolled into the back of her head.  Family member started CPR and pt became responsive.  Family reports pt c/o headaches for the past 2 days and has had fluctuating BPs.  Pt hypertensive with EMS at 230/150.  BP 177/94 on arrival.  Pt also vomiting on arrival.  4mg  of Zofran given by EMS PTA and another 4 given on arrival.

## 2013-10-17 NOTE — ED Provider Notes (Signed)
CSN: [id number]     Arrival date & time 10/17/13  1541 History   First MD Initiated Contact with Patient 10/17/13 1553     Chief Complaint  Patient presents with  . Weakness   (Consider location/radiation/quality/duration/timing/severity/associated sxs/prior Treatment) HPI Comments: Patient is 80 year old female with PMHx significant for dementia, HTN, CAD who is followed by Dr. Herbie Baltimore at [facility] and presents to the ED with her daughter who is a surgical nurse at [employer] and is a very reliable historian.  She states that over the past 2 days her mother has been complaining of a frontal headache, she has been checking her blood pressure and states that it has basically been running normal.  She also states that her mother has been complaining of constipation as well.  She reports that her mother denied a headache this morning and that she was doing her hair when she reports that "her eyes rolled in the back of her head and she slumped over".  She states that she tried to awaken her then went to check for a pulse, she reports that there was no palpable pulse and she was having agonal respirations.  She placed her on the floor and re-checked and then started chest compressions.  She states that she got 2 rounds of CPR with rescue breathing in when her mother began to respond.  She states at that time she called her neighbor, who then called 911 and she continued to monitor her.  She reports that she then came closer to her baseline, states that this episode lasted about 10 minutes total.  The patient does not recall any of this and complains of nausea and generalized weakness.  EMS reports patient to be hypertensive upon their arrival.  Patient is a 80 y.o. female presenting with weakness. The history is provided by the patient, a relative and a caregiver. No language interpreter was used.  Weakness Associated symptoms include weakness.    Past Medical History  Diagnosis Date  . Hyperlipidemia  LDL goal < 100   . Gout, unspecified   . Tension headache   . Unspecified essential hypertension   . Coronary atherosclerosis of native coronary artery   . Pain in joint, site unspecified   . Osteopenia   . Other malaise and fatigue   . Chest pain, unspecified   . Cataract 2010  . Vascular dementia    Past Surgical History  Procedure Laterality Date  . Spine surgery  [phone]  . Tubal ligation  1960   History reviewed. No pertinent family history. History  Substance Use Topics  . Smoking status: Former Games developer  . Smokeless tobacco: Not on file  . Alcohol Use: No   OB History   Grav Para Term Preterm Abortions TAB SAB Ect Mult Living                 Review of Systems  Unable to perform ROS: Dementia  Neurological: Positive for weakness.    Allergies  Review of patient's allergies indicates no known allergies.  Home Medications   Current Outpatient Rx  Name  Route  Sig  Dispense  Refill  . Alpha-D-Galactosidase (BEANO PO)   Oral   Take 2 tablets by mouth daily as needed (gas).         . carbamazepine (CARBATROL) 100 MG 12 hr capsule   Oral   Take 100 mg by mouth 2 (two) times daily.         . clopidogrel (PLAVIX)  75 MG tablet      Take 1 tablet (75 mg total) by mouth daily.   30 tablet   0   . donepezil (ARICEPT) 10 MG tablet   Oral   Take 1 tablet (10 mg total) by mouth at bedtime.   30 tablet   3   . fish oil-omega-3 fatty acids 1000 MG capsule   Oral   Take 1,000 mg by mouth 2 (two) times daily.          Marland Kitchen gabapentin (NEURONTIN) 300 MG capsule   Oral   Take 300 mg by mouth 3 (three) times daily.          . metoprolol succinate (TOPROL-XL) 25 MG 24 hr tablet   Oral   Take 1 tablet (25 mg total) by mouth daily. When sbp is > 150   30 tablet   5   . pantoprazole (PROTONIX) 40 MG tablet   Oral   Take 40 mg by mouth daily.          . ranitidine (ZANTAC) 300 MG tablet   Oral   Take 300 mg by mouth every evening.         .  ranolazine (RANEXA) 500 MG 12 hr tablet   Oral   Take 500 mg by mouth daily.         . simvastatin (ZOCOR) 20 MG tablet   Oral   Take 20 mg by mouth at bedtime.          Marland Kitchen acidophilus (RISAQUAD) CAPS   Oral   Take 1 capsule by mouth daily.           Marland Kitchen amLODipine (NORVASC) 5 MG tablet   Oral   Take 5 mg by mouth daily.           Marland Kitchen aspirin EC 81 MG tablet   Oral   Take 81 mg by mouth at bedtime.           . cholecalciferol (VITAMIN D) 1000 UNITS tablet   Oral   Take 2,000 Units by mouth daily.          Marland Kitchen HYDROcodone-acetaminophen (NORCO/VICODIN) 5-325 MG per tablet               . Lactobacillus Rhamnosus, GG, (CULTURELLE) CAPS   Oral   Take by mouth. Take 1 capsule daily.         Marland Kitchen loratadine (CLARITIN) 10 MG tablet   Oral   Take 10 mg by mouth daily.           Marland Kitchen losartan (COZAAR) 50 MG tablet               . memantine (NAMENDA) 10 MG tablet   Oral   Take 10 mg by mouth 2 (two) times daily. Take 1 tablet by mouth twice daily for memory.         . Misc. Devices (ROLLATOR) MISC      Patient needs for unsteady gait, h/o fall, dementia   1 each   0   . nitroGLYCERIN (NITROSTAT) 0.4 MG SL tablet   Sublingual   Place 0.4 mg under the tongue every 5 (five) minutes as needed for chest pain. Place one under tongue at onset of chest pain. Repeat every 5 minutes up to twice more if needed.         . promethazine (PHENERGAN) 25 MG suppository   Rectal   Place 25 mg rectally every 6 (six) hours as needed  for nausea.         . TraMADol HCl 50 MG TBDP   Oral   Take 50 mg by mouth 3 (three) times daily. Take 1 tablet by mouth three times daily as needed for severe pain.         Marland Kitchen triamterene-hydrochlorothiazide (DYAZIDE) 37.5-25 MG per capsule   Oral   Take 1 capsule by mouth every morning. Take 1 tablet once daily for blood pressure.          BP 178/81  Pulse 69  Resp 19  SpO2 97% Physical Exam  Nursing note and vitals  reviewed. Constitutional: She is oriented to person, place, and time. She appears well-developed and well-nourished. No distress.  HENT:  Head: Normocephalic and atraumatic.  Right Ear: External ear normal.  Left Ear: External ear normal.  Nose: Nose normal.  Mouth/Throat: Oropharynx is clear and moist. No oropharyngeal exudate.  edentulous  Eyes: Conjunctivae and EOM are normal. Pupils are equal, round, and reactive to light. No scleral icterus.  Arcus senilis  Neck: Normal range of motion. Neck supple. No JVD present.  Cardiovascular: Normal rate, regular rhythm, normal heart sounds and intact distal pulses.  Exam reveals no gallop and no friction rub.   No murmur heard. Pulmonary/Chest: Effort normal and breath sounds normal. No respiratory distress. She has no wheezes. She has no rales. She exhibits no tenderness.  Abdominal: Soft. Bowel sounds are normal. She exhibits no distension. There is no tenderness. There is no rebound and no guarding.  Musculoskeletal: Normal range of motion. She exhibits no edema and no tenderness.  Lymphadenopathy:    She has no cervical adenopathy.  Neurological: She is alert and oriented to person, place, and time. She has normal strength. She displays no atrophy. No cranial nerve deficit or sensory deficit. She exhibits normal muscle tone. She displays a negative Romberg sign. Coordination normal. GCS eye subscore is 4. GCS verbal subscore is 5. GCS motor subscore is 6.  Reflex Scores:      Tricep reflexes are 2+ on the right side and 2+ on the left side.      Bicep reflexes are 2+ on the right side and 2+ on the left side.      Patellar reflexes are 2+ on the right side and 2+ on the left side.      Achilles reflexes are 2+ on the right side and 2+ on the left side. Gait not tested due to patient's potential for instability  Skin: Skin is warm and dry. No rash noted. No erythema. No pallor.  Psychiatric: She has a normal mood and affect. Her behavior is  normal. Judgment and thought content normal.    ED Course  Procedures (including critical care time) Labs Review Labs Reviewed  CBC WITH DIFFERENTIAL  COMPREHENSIVE METABOLIC PANEL  MAGNESIUM  PROTIME-INR  APTT  URINALYSIS, ROUTINE W REFLEX MICROSCOPIC   Imaging Review No results found.  EKG Interpretation    Date/Time:  Sunday October 17 2013 15:56:46 EST Ventricular Rate:  71 PR Interval:  238 QRS Duration: 91 QT Interval:  417 QTC Calculation: 453 R Axis:   -24 Text Interpretation:  Sinus rhythm Prolonged PR interval Probable left atrial enlargement LVH with secondary repolarization abnormality Inferior infarct, old non-spec t wave changes in precordial leads Confirmed by HARRISON  MD, FORREST ([id number]) on 10/17/2013 4:17:02 PM          7:30 PM Spoke with cardiology and since her objective data (EKG without concerns, asymptomic  here, troponin and lactate negative) they believe that she may be admitted to the hospitalist and get the syncopal workup.  MDM  Syncope ? S/p cardiac arrest HTN Headache Constipation  Patient here with daughter who reports s/p cardiac arrest though we believe this is likely syncopal.  Her story is rather concerning though for a cardiac event to cause this.  I have discussed this case with Dr. Toniann FailKakrakandy who will admit to the hospital for this.  She has not had any additional nausea since arrival.  Dr. Romeo AppleHarrison has seen the patient with me.     Izola PriceFrances C. Marisue HumbleSanford, PA-C 10/17/13 2016

## 2013-10-18 ENCOUNTER — Encounter (HOSPITAL_COMMUNITY): Payer: Self-pay | Admitting: General Practice

## 2013-10-18 DIAGNOSIS — I10 Essential (primary) hypertension: Secondary | ICD-10-CM | POA: Diagnosis not present

## 2013-10-18 DIAGNOSIS — I672 Cerebral atherosclerosis: Secondary | ICD-10-CM | POA: Diagnosis not present

## 2013-10-18 DIAGNOSIS — R569 Unspecified convulsions: Secondary | ICD-10-CM | POA: Diagnosis not present

## 2013-10-18 DIAGNOSIS — I359 Nonrheumatic aortic valve disorder, unspecified: Secondary | ICD-10-CM | POA: Diagnosis not present

## 2013-10-18 DIAGNOSIS — F0150 Vascular dementia without behavioral disturbance: Secondary | ICD-10-CM | POA: Diagnosis not present

## 2013-10-18 DIAGNOSIS — F0390 Unspecified dementia without behavioral disturbance: Secondary | ICD-10-CM | POA: Diagnosis not present

## 2013-10-18 DIAGNOSIS — N179 Acute kidney failure, unspecified: Secondary | ICD-10-CM | POA: Diagnosis not present

## 2013-10-18 DIAGNOSIS — I2510 Atherosclerotic heart disease of native coronary artery without angina pectoris: Secondary | ICD-10-CM | POA: Diagnosis not present

## 2013-10-18 DIAGNOSIS — R55 Syncope and collapse: Secondary | ICD-10-CM | POA: Diagnosis not present

## 2013-10-18 LAB — URINE MICROSCOPIC-ADD ON

## 2013-10-18 LAB — CBC WITH DIFFERENTIAL/PLATELET
Basophils Absolute: 0 10*3/uL (ref 0.0–0.1)
Basophils Relative: 1 % (ref 0–1)
EOS PCT: 3 % (ref 0–5)
Eosinophils Absolute: 0.1 10*3/uL (ref 0.0–0.7)
HCT: 34.9 % — ABNORMAL LOW (ref 36.0–46.0)
Hemoglobin: 11.6 g/dL — ABNORMAL LOW (ref 12.0–15.0)
Lymphocytes Relative: 44 % (ref 12–46)
Lymphs Abs: 1.9 10*3/uL (ref 0.7–4.0)
MCH: 29.2 pg (ref 26.0–34.0)
MCHC: 33.2 g/dL (ref 30.0–36.0)
MCV: 87.9 fL (ref 78.0–100.0)
Monocytes Absolute: 0.4 10*3/uL (ref 0.1–1.0)
Monocytes Relative: 8 % (ref 3–12)
NEUTROS ABS: 1.9 10*3/uL (ref 1.7–7.7)
Neutrophils Relative %: 44 % (ref 43–77)
Platelets: 187 10*3/uL (ref 150–400)
RBC: 3.97 MIL/uL (ref 3.87–5.11)
RDW: 14.3 % (ref 11.5–15.5)
WBC: 4.2 10*3/uL (ref 4.0–10.5)

## 2013-10-18 LAB — COMPREHENSIVE METABOLIC PANEL
ALK PHOS: 82 U/L (ref 39–117)
ALT: 6 U/L (ref 0–35)
AST: 10 U/L (ref 0–37)
Albumin: 3.6 g/dL (ref 3.5–5.2)
BUN: 21 mg/dL (ref 6–23)
CHLORIDE: 105 meq/L (ref 96–112)
CO2: 25 meq/L (ref 19–32)
Calcium: 9.3 mg/dL (ref 8.4–10.5)
Creatinine, Ser: 1.44 mg/dL — ABNORMAL HIGH (ref 0.50–1.10)
GFR, EST AFRICAN AMERICAN: 36 mL/min — AB (ref >90)
GFR, EST NON AFRICAN AMERICAN: 31 mL/min — AB (ref >90)
GLUCOSE: 78 mg/dL (ref 70–99)
Potassium: 3.9 mEq/L (ref 3.7–5.3)
Sodium: 143 mEq/L (ref 137–147)
Total Bilirubin: 0.3 mg/dL (ref 0.3–1.2)
Total Protein: 7 g/dL (ref 6.0–8.3)

## 2013-10-18 LAB — URINALYSIS, ROUTINE W REFLEX MICROSCOPIC
Bilirubin Urine: NEGATIVE
Glucose, UA: NEGATIVE mg/dL
HGB URINE DIPSTICK: NEGATIVE
Ketones, ur: NEGATIVE mg/dL
Nitrite: NEGATIVE
PROTEIN: 30 mg/dL — AB
Specific Gravity, Urine: 1.025 (ref 1.005–1.030)
Urobilinogen, UA: 0.2 mg/dL (ref 0.0–1.0)
pH: 5.5 (ref 5.0–8.0)

## 2013-10-18 LAB — TROPONIN I
Troponin I: <0.3 ng/mL (ref <0.30)
Troponin I: <0.3 ng/mL (ref <0.30)

## 2013-10-18 LAB — OCCULT BLOOD X 1 CARD TO LAB, STOOL: FECAL OCCULT BLD: NEGATIVE

## 2013-10-18 LAB — TSH: TSH: 0.734 u[IU]/mL (ref 0.350–4.500)

## 2013-10-18 MED ORDER — PNEUMOCOCCAL VAC POLYVALENT 25 MCG/0.5ML IJ INJ
0.5000 mL | INJECTION | INTRAMUSCULAR | Status: DC
  Filled 2013-10-18: qty 0.5

## 2013-10-18 MED ORDER — PANTOPRAZOLE SODIUM 40 MG PO TBEC
40.0000 mg | DELAYED_RELEASE_TABLET | Freq: Two times a day (BID) | ORAL | Status: DC
  Administered 2013-10-18 (×2): 40 mg via ORAL
  Filled 2013-10-18 (×2): qty 1

## 2013-10-18 NOTE — Progress Notes (Signed)
  Echocardiogram 2D Echocardiogram has been performed.  Emily Schultz 10/18/2013, 12:15 PM

## 2013-10-18 NOTE — ED Provider Notes (Signed)
Medical screening examination/treatment/procedure(s) were conducted as a shared visit with non-physician practitioner(s) and myself.  I personally evaluated the patient during the encounter.     Date: 10/18/2013  Rate: 71  Rhythm: normal sinus rhythm  QRS Axis: normal  Intervals: PR prolonged  ST/T Wave abnormalities: nonspecific T wave changes  Conduction Disutrbances:first-degree A-V block   Narrative Interpretation: 1st degree AV block, old inf infarct, non-spec t wave changes in precordial leads  Old EKG Reviewed: changes noted  I interviewed and examined the patient. Lungs are CTAB. Cardiac exam wnl. Abdomen soft.  Unknown etiology of pt's episode of unconsciousness. Daughter did not feel a pulse and cpr was performed. Pt has no complaints on exam here, appears drowsy. Will plan on admission.     Junius Argyle, MD 10/18/13 1215

## 2013-10-18 NOTE — Care Management (Signed)
UR Completed Hong Moring Graves-Bigelow, RN,BSN [phone]  

## 2013-10-18 NOTE — Progress Notes (Signed)
Subjective: Patient complaining of mild nausea this morning and had a loose stool. No further syncope or seizure activity. Small amount of left upper chest chest pain this a.m. but not associated with shortness of breath or diaphoresis. Patient is alert and conversive. Had 20 or 30 minutes of seizure-like activity, coherent, 2 days prior to her syncopal episode yesterday. First and her daughter noticed yesterday at the time of syncope was her eyes rolling back in her head and then slumping forward. No obvious tonic-clonic jerking at the time. Had been having a headache earlier in the day. Otherwise some mild nausea and a loose stool this morning, she feels well. No shortness of breath or other complaints except as above. Patient has pyuria but only a few bacteria on urinalysis.  Objective: Weight change:   Intake/Output Summary (Last 24 hours) at 10/18/13 0732 Last data filed at 10/17/13 2100  Gross per 24 hour  Intake    480 ml  Output      0 ml  Net    480 ml   Filed Vitals:   10/17/13 2014 10/17/13 2100 10/17/13 2145 10/18/13 0500  BP: 203/86 131/97 160/85 131/95  Pulse: 67 51 58 59  Temp:   97.8 F (36.6 C) 98.2 F (36.8 C)  Resp: '14  18 16  ' Height:   '5\' 2"'  (1.575 m)   Weight:   66.906 kg (147 lb 8 oz)   SpO2: 100% 97% 100% 95%    General Appearance: Alert, cooperative, no distress, appears stated age Lungs: Clear to auscultation bilaterally, respirations unlabored Heart: Regular rate and rhythm, S1 and S2 normal, no murmur, rub or gallop Abdomen: Soft, mildly tender epigastrically, bowel sounds active all four quadrants, no masses, no organomegaly Extremities: Extremities normal, atraumatic, no cyanosis or edema Neuro: Alert and oriented, nonfocal  Lab Results: Results for orders placed during the hospital encounter of 10/17/13 (from the past 48 hour(s))  CBC WITH DIFFERENTIAL     Status: None   Collection Time    10/17/13  4:35 PM      Result Value Range   WBC 4.6  4.0 -  10.5 K/uL   RBC 4.30  3.87 - 5.11 MIL/uL   Hemoglobin 12.8  12.0 - 15.0 g/dL   HCT 37.9  36.0 - 46.0 %   MCV 88.1  78.0 - 100.0 fL   MCH 29.8  26.0 - 34.0 pg   MCHC 33.8  30.0 - 36.0 g/dL   RDW 14.4  11.5 - 15.5 %   Platelets 200  150 - 400 K/uL   Neutrophils Relative % 49  43 - 77 %   Neutro Abs 2.3  1.7 - 7.7 K/uL   Lymphocytes Relative 42  12 - 46 %   Lymphs Abs 2.0  0.7 - 4.0 K/uL   Monocytes Relative 6  3 - 12 %   Monocytes Absolute 0.3  0.1 - 1.0 K/uL   Eosinophils Relative 2  0 - 5 %   Eosinophils Absolute 0.1  0.0 - 0.7 K/uL   Basophils Relative 1  0 - 1 %   Basophils Absolute 0.0  0.0 - 0.1 K/uL  COMPREHENSIVE METABOLIC PANEL     Status: Abnormal   Collection Time    10/17/13  4:35 PM      Result Value Range   Sodium 138  137 - 147 mEq/L   Potassium 3.7  3.7 - 5.3 mEq/L   Chloride 98  96 - 112 mEq/L   CO2  24  19 - 32 mEq/L   Glucose, Bld 111 (*) 70 - 99 mg/dL   BUN 23  6 - 23 mg/dL   Creatinine, Ser 1.83 (*) 0.50 - 1.10 mg/dL   Calcium 9.6  8.4 - 10.5 mg/dL   Total Protein 7.7  6.0 - 8.3 g/dL   Albumin 4.0  3.5 - 5.2 g/dL   AST 13  0 - 37 U/L   ALT 7  0 - 35 U/L   Alkaline Phosphatase 91  39 - 117 U/L   Total Bilirubin 0.2 (*) 0.3 - 1.2 mg/dL   GFR calc non Af Amer 23 (*) >90 mL/min   GFR calc Af Amer 27 (*) >90 mL/min   Comment: (NOTE)     The eGFR has been calculated using the CKD EPI equation.     This calculation has not been validated in all clinical situations.     eGFR's persistently <90 mL/min signify possible Chronic Kidney     Disease.  MAGNESIUM     Status: None   Collection Time    10/17/13  4:35 PM      Result Value Range   Magnesium 2.0  1.5 - 2.5 mg/dL  PROTIME-INR     Status: None   Collection Time    10/17/13  4:35 PM      Result Value Range   Prothrombin Time 12.8  11.6 - 15.2 seconds   INR 0.98  0.00 - 1.49  APTT     Status: None   Collection Time    10/17/13  4:35 PM      Result Value Range   aPTT 25  24 - 37 seconds  POCT  I-STAT TROPONIN I     Status: None   Collection Time    10/17/13  4:41 PM      Result Value Range   Troponin i, poc 0.01  0.00 - 0.08 ng/mL   Comment 3            Comment: Due to the release kinetics of cTnI,     a negative result within the first hours     of the onset of symptoms does not rule out     myocardial infarction with certainty.     If myocardial infarction is still suspected,     repeat the test at appropriate intervals.  CG4 I-STAT (LACTIC ACID)     Status: None   Collection Time    10/17/13  4:44 PM      Result Value Range   Lactic Acid, Venous 1.43  0.5 - 2.2 mmol/L  CARBAMAZEPINE LEVEL, TOTAL     Status: None   Collection Time    10/17/13 10:30 PM      Result Value Range   Carbamazepine Lvl 5.4  4.0 - 12.0 ug/mL  TROPONIN I     Status: None   Collection Time    10/17/13 10:30 PM      Result Value Range   Troponin I <0.30  <0.30 ng/mL   Comment:            Due to the release kinetics of cTnI,     a negative result within the first hours     of the onset of symptoms does not rule out     myocardial infarction with certainty.     If myocardial infarction is still suspected,     repeat the test at appropriate intervals.  URINALYSIS, ROUTINE W REFLEX MICROSCOPIC  Status: Abnormal   Collection Time    10/18/13  5:45 AM      Result Value Range   Color, Urine YELLOW  YELLOW   APPearance CLOUDY (*) CLEAR   Specific Gravity, Urine 1.025  1.005 - 1.030   pH 5.5  5.0 - 8.0   Glucose, UA NEGATIVE  NEGATIVE mg/dL   Hgb urine dipstick NEGATIVE  NEGATIVE   Bilirubin Urine NEGATIVE  NEGATIVE   Ketones, ur NEGATIVE  NEGATIVE mg/dL   Protein, ur 30 (*) NEGATIVE mg/dL   Urobilinogen, UA 0.2  0.0 - 1.0 mg/dL   Nitrite NEGATIVE  NEGATIVE   Leukocytes, UA MODERATE (*) NEGATIVE  URINE MICROSCOPIC-ADD ON     Status: Abnormal   Collection Time    10/18/13  5:45 AM      Result Value Range   Squamous Epithelial / LPF FEW (*) RARE   WBC, UA 21-50  <3 WBC/hpf   RBC / HPF  0-2  <3 RBC/hpf   Bacteria, UA FEW (*) RARE  COMPREHENSIVE METABOLIC PANEL     Status: Abnormal   Collection Time    10/18/13  6:10 AM      Result Value Range   Sodium 143  137 - 147 mEq/L   Potassium 3.9  3.7 - 5.3 mEq/L   Chloride 105  96 - 112 mEq/L   CO2 25  19 - 32 mEq/L   Glucose, Bld 78  70 - 99 mg/dL   BUN 21  6 - 23 mg/dL   Creatinine, Ser 1.44 (*) 0.50 - 1.10 mg/dL   Calcium 9.3  8.4 - 10.5 mg/dL   Total Protein 7.0  6.0 - 8.3 g/dL   Albumin 3.6  3.5 - 5.2 g/dL   AST 10  0 - 37 U/L   ALT 6  0 - 35 U/L   Alkaline Phosphatase 82  39 - 117 U/L   Total Bilirubin 0.3  0.3 - 1.2 mg/dL   GFR calc non Af Amer 31 (*) >90 mL/min   GFR calc Af Amer 36 (*) >90 mL/min   Comment: (NOTE)     The eGFR has been calculated using the CKD EPI equation.     This calculation has not been validated in all clinical situations.     eGFR's persistently <90 mL/min signify possible Chronic Kidney     Disease.  CBC WITH DIFFERENTIAL     Status: Abnormal   Collection Time    10/18/13  6:10 AM      Result Value Range   WBC 4.2  4.0 - 10.5 K/uL   RBC 3.97  3.87 - 5.11 MIL/uL   Hemoglobin 11.6 (*) 12.0 - 15.0 g/dL   HCT 34.9 (*) 36.0 - 46.0 %   MCV 87.9  78.0 - 100.0 fL   MCH 29.2  26.0 - 34.0 pg   MCHC 33.2  30.0 - 36.0 g/dL   RDW 14.3  11.5 - 15.5 %   Platelets 187  150 - 400 K/uL   Neutrophils Relative % 44  43 - 77 %   Neutro Abs 1.9  1.7 - 7.7 K/uL   Lymphocytes Relative 44  12 - 46 %   Lymphs Abs 1.9  0.7 - 4.0 K/uL   Monocytes Relative 8  3 - 12 %   Monocytes Absolute 0.4  0.1 - 1.0 K/uL   Eosinophils Relative 3  0 - 5 %   Eosinophils Absolute 0.1  0.0 - 0.7 K/uL  Basophils Relative 1  0 - 1 %   Basophils Absolute 0.0  0.0 - 0.1 K/uL  TROPONIN I     Status: None   Collection Time    10/18/13  6:10 AM      Result Value Range   Troponin I <0.30  <0.30 ng/mL   Comment:            Due to the release kinetics of cTnI,     a negative result within the first hours     of the onset  of symptoms does not rule out     myocardial infarction with certainty.     If myocardial infarction is still suspected,     repeat the test at appropriate intervals.    Studies/Results: Ct Head Wo Contrast  10/17/2013   CLINICAL DATA:  Unresponsive.  EXAM: CT HEAD WITHOUT CONTRAST  TECHNIQUE: Contiguous axial images were obtained from the base of the skull through the vertex without intravenous contrast.  COMPARISON:  July 08, 2013  FINDINGS: There is chronic diffuse atrophy. Chronic bilateral periventricular white matter small vessel ischemic changes identified. 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 There is no midline shift, hydrocephalus, or mass. No acute hemorrhage or acute transcortical infarct is identified. The bony calvarium is intact. There is minimal mucoperiosteal thickening of bilateral ethmoid sinuses.  IMPRESSION: No focal acute intracranial abnormality identified. Chronic diffuse atrophy. Chronic bilateral periventricular white matter small vessel ischemic change.   Electronically Signed   By: Abelardo Diesel M.D.   On: 10/17/2013 19:50   Dg Chest Portable 1 View  10/17/2013   CLINICAL DATA:  Syncopal episode with weakness today. Constipation and headaches for 3 days. History of hypertension and dementia.  EXAM: PORTABLE CHEST - 1 VIEW  COMPARISON:  None.  FINDINGS: There is mild cardiomegaly and aortic atherosclerosis. The lungs are clear. There is no pleural effusion or pneumothorax. Advanced glenohumeral degenerative changes are present bilaterally with fragmentation and subchondral collapse of both humeral heads.  IMPRESSION: No acute cardiopulmonary process.  Mild cardiomegaly.   Electronically Signed   By: Camie Patience M.D.   On: 10/17/2013 17:35   Medications: Scheduled Meds: . amLODipine  5 mg Oral Daily  . aspirin EC  81 mg Oral QHS  . carbamazepine  100 mg Oral BID  . clopidogrel  75 mg Oral Q breakfast  . donepezil  10 mg Oral QHS  .  enoxaparin (LOVENOX) injection  30 mg Subcutaneous Q24H  . famotidine  20 mg Oral QHS  . gabapentin  300 mg Oral TID  . losartan  50 mg Oral Daily  . metoprolol succinate  25 mg Oral Daily  . omega-3 acid ethyl esters  1 g Oral BID  . pantoprazole  40 mg Oral Daily  . ranolazine  500 mg Oral Daily  . saccharomyces boulardii  250 mg Oral Daily  . simvastatin  20 mg Oral QHS  . sodium chloride  3 mL Intravenous Q12H   Continuous Infusions: . sodium chloride 75 mL/hr at 10/17/13 2332   PRN Meds:.acetaminophen, acetaminophen, hydrALAZINE, HYDROcodone-acetaminophen, loratadine, nitroGLYCERIN, ondansetron (ZOFRAN) IV, ondansetron  Assessment/Plan: Principal Problem:   Syncope - not sure of etiology. Could have been an arrhythmia. Will ambulate patient today and continue to monitor. Carbamazepine level is normal Active Problems:   Vascular dementia - aware. Take seizure activity more likely   Coronary atherosclerosis of native coronary artery - troponins are negative so far. Continue to monitor  Hyperlipidemia   History of seizure - carbamazepine level is therapeutic   Renal failure (ARF), acute on chronic   Abdominal discomfort and nausea with loose stool - patient could have gastritis or mild gastroenteritis. Continue Protonix and                     Observed. Hemoccult stool   Headache - resolved   Gout - asymptomatic   Possible UTI - check urine culture    LOS: 1 day   Henrine Screws, MD 10/18/2013, 7:32 AM

## 2013-10-19 ENCOUNTER — Other Ambulatory Visit: Payer: Self-pay | Admitting: *Deleted

## 2013-10-19 DIAGNOSIS — I2510 Atherosclerotic heart disease of native coronary artery without angina pectoris: Secondary | ICD-10-CM | POA: Diagnosis not present

## 2013-10-19 DIAGNOSIS — R569 Unspecified convulsions: Secondary | ICD-10-CM | POA: Diagnosis not present

## 2013-10-19 DIAGNOSIS — I10 Essential (primary) hypertension: Secondary | ICD-10-CM | POA: Diagnosis not present

## 2013-10-19 DIAGNOSIS — R55 Syncope and collapse: Secondary | ICD-10-CM | POA: Diagnosis not present

## 2013-10-19 DIAGNOSIS — I672 Cerebral atherosclerosis: Secondary | ICD-10-CM | POA: Diagnosis not present

## 2013-10-19 DIAGNOSIS — N179 Acute kidney failure, unspecified: Secondary | ICD-10-CM | POA: Diagnosis not present

## 2013-10-19 DIAGNOSIS — F0150 Vascular dementia without behavioral disturbance: Secondary | ICD-10-CM | POA: Diagnosis not present

## 2013-10-19 DIAGNOSIS — F0390 Unspecified dementia without behavioral disturbance: Secondary | ICD-10-CM | POA: Diagnosis not present

## 2013-10-19 DIAGNOSIS — I350 Nonrheumatic aortic (valve) stenosis: Secondary | ICD-10-CM | POA: Diagnosis present

## 2013-10-19 LAB — CBC WITH DIFFERENTIAL/PLATELET
BASOS ABS: 0 10*3/uL (ref 0.0–0.1)
Basophils Relative: 1 % (ref 0–1)
Eosinophils Absolute: 0.2 10*3/uL (ref 0.0–0.7)
Eosinophils Relative: 4 % (ref 0–5)
HCT: 33.5 % — ABNORMAL LOW (ref 36.0–46.0)
Hemoglobin: 11.2 g/dL — ABNORMAL LOW (ref 12.0–15.0)
LYMPHS PCT: 57 % — AB (ref 12–46)
Lymphs Abs: 2.3 10*3/uL (ref 0.7–4.0)
MCH: 29.2 pg (ref 26.0–34.0)
MCHC: 33.4 g/dL (ref 30.0–36.0)
MCV: 87.5 fL (ref 78.0–100.0)
Monocytes Absolute: 0.3 10*3/uL (ref 0.1–1.0)
Monocytes Relative: 8 % (ref 3–12)
Neutro Abs: 1.2 10*3/uL — ABNORMAL LOW (ref 1.7–7.7)
Neutrophils Relative %: 31 % — ABNORMAL LOW (ref 43–77)
PLATELETS: 179 10*3/uL (ref 150–400)
RBC: 3.83 MIL/uL — ABNORMAL LOW (ref 3.87–5.11)
RDW: 14.4 % (ref 11.5–15.5)
WBC: 4.1 10*3/uL (ref 4.0–10.5)

## 2013-10-19 LAB — URINE CULTURE: Colony Count: 25000

## 2013-10-19 LAB — BASIC METABOLIC PANEL
BUN: 19 mg/dL (ref 6–23)
CHLORIDE: 106 meq/L (ref 96–112)
CO2: 23 mEq/L (ref 19–32)
CREATININE: 1.35 mg/dL — AB (ref 0.50–1.10)
Calcium: 9.1 mg/dL (ref 8.4–10.5)
GFR calc Af Amer: 39 mL/min — ABNORMAL LOW (ref >90)
GFR calc non Af Amer: 34 mL/min — ABNORMAL LOW (ref >90)
Glucose, Bld: 79 mg/dL (ref 70–99)
Potassium: 3.9 mEq/L (ref 3.7–5.3)
SODIUM: 144 meq/L (ref 137–147)

## 2013-10-19 MED ORDER — TRAMADOL HCL 50 MG PO TBDP: 50.0000 mg | ORAL_TABLET | Freq: Three times a day (TID) | ORAL | Status: AC

## 2013-10-19 MED ORDER — TRIAMTERENE-HCTZ 37.5-25 MG PO CAPS: 1.0000 | ORAL_CAPSULE | ORAL | Status: AC

## 2013-10-19 NOTE — Discharge Summary (Signed)
Physician Discharge Summary  NAME:Emily Schultz  ZOX:[id number]  DOB: May 13, 1922   Admit date: 10/17/2013 Discharge date: 10/19/2013  Discharge Diagnoses:  Principal Problem:   Syncope - etiology undetermined Active Problems:   Vascular dementia - stable   Coronary atherosclerosis of native coronary artery - stable   Hyperlipidemia   History of seizure   Renal failure (ARF), acute on chronic - resolved   Aortic stenosis, moderate - per 2-D echo, 10/18/2013, EF 50-55% with grade 1 diastolic dysfunction  Discharge Physical Exam:  General Appearance: Alert, cooperative, no distress, appears stated age  Weight change:   Intake/Output Summary (Last 24 hours) at 10/19/13 0805 Last data filed at 10/19/13 8119  Gross per 24 hour  Intake    250 ml  Output      0 ml  Net    250 ml   Filed Vitals:   10/18/13 1012 10/18/13 1500 10/18/13 2100 10/19/13 0500  BP: 122/60 130/67 117/57 138/78  Pulse: 60 62 58 62  Temp:  98.6 F (37 C) 98.1 F (36.7 C) 98.2 F (36.8 C)  TempSrc:  Oral    Resp:  16 18 18   Height:      Weight:      SpO2:  96% 100% 97%   HEENT:  Atraumatic normocephalic Neck:  No thyromegaly or bruits or JVD Lungs: Clear to auscultation bilaterally, respirations unlabored Heart: Regular rate and rhythm, S1 and S2 normal, 2/6 systolic ejection murmur, right second intercostal space, no rub or gallop Abdomen: Soft, non-tender, bowel sounds active all four quadrants, no masses, no organomegaly Extremities: Extremities normal, atraumatic, no cyanosis or edema Neuro: Alert, conversive, nonfocal  Discharge Condition: Improved  Hospital Course: Mrs. Emily Schultz and treat as a very pleasant 80 year old female with a history of mild dementia, hypertension, coronary artery disease and seizures as well as hyperlipidemia. On the day of admission, 10/17/2013, her daughter was doing the patient's hair in her head was flexed forward. She had sudden syncope and she was positioned on the  floor and after 2 chest compressions and a breath provided by the daughter, she responded and awakened. Apparently she did not have a pulse initially. She's been monitored now for approximately 48 hours in the hospital with no arrhythmias and no syncope or presyncope. Feels well overall. Ambulated in the halls yesterday successfully. Monitoring revealed no arrhythmias. 2-D echocardiogram revealed mild to moderate aortic stenosis and an EF of 50-55% with no wall motion abnormalities. Grade 1 diastolic dysfunction. On admission, Maxzide was held for mild acute on chronic renal insufficiency and creatinine has improved. Will discharge on Maxzide 3 days a week for hypertension  Things to follow up in the outpatient setting: Mental status, monitor for lightheadedness or dizziness, followup in office asked week  Consults: Noncontributory  Disposition: Discharge home  Discharge Orders   Future Appointments Provider Department Dept Phone   12/20/2013 9:15 AM Santiago Bumpers, DPM [facility] [phone]   Future Orders Complete By Expires   (HEART FAILURE PATIENTS) Call MD:  Anytime you have any of the following symptoms: 1) 3 pound weight gain in 24 hours or 5 pounds in 1 week 2) shortness of breath, with or without a dry hacking cough 3) swelling in the hands, feet or stomach 4) if you have to sleep on extra pillows at night in order to breathe.  As directed    Call MD for:  difficulty breathing, headache or visual disturbances  As directed    Call MD for:  extreme fatigue  As directed    Call MD for:  persistant nausea and vomiting  As directed    Call MD for:  temperature >100.4  As directed    Diet - low sodium heart healthy  As directed    Increase activity slowly  As directed        Medication List         acidophilus Caps capsule  Take 1 capsule by mouth daily.     amLODipine 5 MG tablet  Commonly known as:  NORVASC  Take 5 mg by mouth daily.     aspirin EC 81 MG  tablet  Take 81 mg by mouth at bedtime.     BEANO PO  Take 2 tablets by mouth daily as needed (gas).     carbamazepine 100 MG 12 hr capsule  Commonly known as:  CARBATROL  Take 100 mg by mouth 2 (two) times daily.     cholecalciferol 1000 UNITS tablet  Commonly known as:  VITAMIN D  Take 2,000 Units by mouth daily.     clopidogrel 75 MG tablet  Commonly known as:  PLAVIX  Take 1 tablet (75 mg total) by mouth daily.     CULTURELLE Caps  Take 1 capsule by mouth daily.     donepezil 10 MG tablet  Commonly known as:  ARICEPT  Take 1 tablet (10 mg total) by mouth at bedtime.     fish oil-omega-3 fatty acids 1000 MG capsule  Take 1,000 mg by mouth 2 (two) times daily.     gabapentin 300 MG capsule  Commonly known as:  NEURONTIN  Take 300 mg by mouth 3 (three) times daily.     HYDROcodone-acetaminophen 5-325 MG per tablet  Commonly known as:  NORCO/VICODIN  Take 1 tablet by mouth every 6 (six) hours as needed for moderate pain.     loratadine 10 MG tablet  Commonly known as:  CLARITIN  Take 10 mg by mouth daily as needed for allergies.     losartan 50 MG tablet  Commonly known as:  COZAAR  Take 50 mg by mouth daily.     metoprolol succinate 25 MG 24 hr tablet  Commonly known as:  TOPROL-XL  Take 1 tablet (25 mg total) by mouth daily. When sbp is > 150     nitroGLYCERIN 0.4 MG SL tablet  Commonly known as:  NITROSTAT  Place 0.4 mg under the tongue every 5 (five) minutes as needed for chest pain. Place one under tongue at onset of chest pain. Repeat every 5 minutes up to twice more if needed.     pantoprazole 40 MG tablet  Commonly known as:  PROTONIX  Take 40 mg by mouth daily.     ranitidine 300 MG tablet  Commonly known as:  ZANTAC  Take 300 mg by mouth every evening.     ranolazine 500 MG 12 hr tablet  Commonly known as:  RANEXA  Take 500 mg by mouth daily.     ROLLATOR Misc  Patient needs for unsteady gait, h/o fall, dementia     simvastatin 20 MG tablet   Commonly known as:  ZOCOR  Take 20 mg by mouth at bedtime.     TraMADol HCl 50 MG Tbdp  Take 50 mg by mouth 3 (three) times daily. Take 1 tablet by mouth three times daily as needed for severe pain.     triamterene-hydrochlorothiazide 37.5-25 MG per capsule  Commonly known as:  DYAZIDE  Take  1 each (1 capsule total) by mouth 3 (three) times a week. Take 1 tablet once daily for blood pressure.         The results of significant diagnostics from this hospitalization (including imaging, microbiology, ancillary and laboratory) are listed below for reference.    Significant Diagnostic Studies: Ct Head Wo Contrast  10/17/2013   CLINICAL DATA:  Unresponsive.  EXAM: CT HEAD WITHOUT CONTRAST  TECHNIQUE: Contiguous axial images were obtained from the base of the skull through the vertex without intravenous contrast.  COMPARISON:  July 08, 2013  FINDINGS: There is chronic diffuse atrophy. Chronic bilateral periventricular white matter small vessel ischemic changes identified. 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 There is no midline shift, hydrocephalus, or mass. No acute hemorrhage or acute transcortical infarct is identified. The bony calvarium is intact. There is minimal mucoperiosteal thickening of bilateral ethmoid sinuses.  IMPRESSION: No focal acute intracranial abnormality identified. Chronic diffuse atrophy. Chronic bilateral periventricular white matter small vessel ischemic change.   Electronically Signed   By: Sherian Rein M.D.   On: 10/17/2013 19:50   Dg Chest Portable 1 View  10/17/2013   CLINICAL DATA:  Syncopal episode with weakness today. Constipation and headaches for 3 days. History of hypertension and dementia.  EXAM: PORTABLE CHEST - 1 VIEW  COMPARISON:  None.  FINDINGS: There is mild cardiomegaly and aortic atherosclerosis. The lungs are clear. There is no pleural effusion or pneumothorax. Advanced glenohumeral degenerative changes are present  bilaterally with fragmentation and subchondral collapse of both humeral heads.  IMPRESSION: No acute cardiopulmonary process.  Mild cardiomegaly.   Electronically Signed   By: Roxy Horseman M.D.   On: 10/17/2013 17:35    Microbiology: Recent Results (from the past 240 hour(s))  URINE CULTURE     Status: None   Collection Time    10/18/13  5:45 AM      Result Value Range Status   Specimen Description URINE, CLEAN CATCH   Final   Special Requests ADDED [phone]   Final   Culture  Setup Time     Final   Value: 10/18/2013 07:33     Performed at [facility]   Colony Count     Final   Value: 25,000 COLONIES/ML     Performed at [facility]   Culture     Final   Value: Multiple bacterial morphotypes present, none predominant. Suggest appropriate recollection if clinically indicated.     Performed at [facility]   Report Status 10/19/2013 FINAL   Final     Labs: Results for orders placed during the hospital encounter of 10/17/13  URINE CULTURE      Result Value Range   Specimen Description URINE, CLEAN CATCH     Special Requests ADDED [phone]     Culture  Setup Time       Value: 10/18/2013 07:33     Performed at [facility]   Colony Count       Value: 25,000 COLONIES/ML     Performed at [facility]   Culture       Value: Multiple bacterial morphotypes present, none predominant. Suggest appropriate recollection if clinically indicated.     Performed at [facility]   Report Status 10/19/2013 FINAL    CBC WITH DIFFERENTIAL      Result Value Range   WBC 4.6  4.0 - 10.5 K/uL   RBC 4.30  3.87 - 5.11 MIL/uL  Hemoglobin 12.8  12.0 - 15.0 g/dL   HCT 91.437.9  78.236.0 - 95.646.0 %   MCV 88.1  78.0 - 100.0 fL   MCH 29.8  26.0 - 34.0 pg   MCHC 33.8  30.0 - 36.0 g/dL   RDW 21.314.4  08.611.5 - 57.815.5 %   Platelets 200  150 - 400 K/uL   Neutrophils Relative % 49  43 - 77 %   Neutro Abs 2.3  1.7 - 7.7 K/uL   Lymphocytes Relative 42  12 - 46 %   Lymphs Abs 2.0   0.7 - 4.0 K/uL   Monocytes Relative 6  3 - 12 %   Monocytes Absolute 0.3  0.1 - 1.0 K/uL   Eosinophils Relative 2  0 - 5 %   Eosinophils Absolute 0.1  0.0 - 0.7 K/uL   Basophils Relative 1  0 - 1 %   Basophils Absolute 0.0  0.0 - 0.1 K/uL  COMPREHENSIVE METABOLIC PANEL      Result Value Range   Sodium 138  137 - 147 mEq/L   Potassium 3.7  3.7 - 5.3 mEq/L   Chloride 98  96 - 112 mEq/L   CO2 24  19 - 32 mEq/L   Glucose, Bld 111 (*) 70 - 99 mg/dL   BUN 23  6 - 23 mg/dL   Creatinine, Ser 4.691.83 (*) 0.50 - 1.10 mg/dL   Calcium 9.6  8.4 - 62.910.5 mg/dL   Total Protein 7.7  6.0 - 8.3 g/dL   Albumin 4.0  3.5 - 5.2 g/dL   AST 13  0 - 37 U/L   ALT 7  0 - 35 U/L   Alkaline Phosphatase 91  39 - 117 U/L   Total Bilirubin 0.2 (*) 0.3 - 1.2 mg/dL   GFR calc non Af Amer 23 (*) >90 mL/min   GFR calc Af Amer 27 (*) >90 mL/min  MAGNESIUM      Result Value Range   Magnesium 2.0  1.5 - 2.5 mg/dL  PROTIME-INR      Result Value Range   Prothrombin Time 12.8  11.6 - 15.2 seconds   INR 0.98  0.00 - 1.49  APTT      Result Value Range   aPTT 25  24 - 37 seconds  URINALYSIS, ROUTINE W REFLEX MICROSCOPIC      Result Value Range   Color, Urine YELLOW  YELLOW   APPearance CLOUDY (*) CLEAR   Specific Gravity, Urine 1.025  1.005 - 1.030   pH 5.5  5.0 - 8.0   Glucose, UA NEGATIVE  NEGATIVE mg/dL   Hgb urine dipstick NEGATIVE  NEGATIVE   Bilirubin Urine NEGATIVE  NEGATIVE   Ketones, ur NEGATIVE  NEGATIVE mg/dL   Protein, ur 30 (*) NEGATIVE mg/dL   Urobilinogen, UA 0.2  0.0 - 1.0 mg/dL   Nitrite NEGATIVE  NEGATIVE   Leukocytes, UA MODERATE (*) NEGATIVE  CARBAMAZEPINE LEVEL, TOTAL      Result Value Range   Carbamazepine Lvl 5.4  4.0 - 12.0 ug/mL  COMPREHENSIVE METABOLIC PANEL      Result Value Range   Sodium 143  137 - 147 mEq/L   Potassium 3.9  3.7 - 5.3 mEq/L   Chloride 105  96 - 112 mEq/L   CO2 25  19 - 32 mEq/L   Glucose, Bld 78  70 - 99 mg/dL   BUN 21  6 - 23 mg/dL   Creatinine, Ser 5.281.44 (*)  0.50 - 1.10 mg/dL  Calcium 9.3  8.4 - 10.5 mg/dL   Total Protein 7.0  6.0 - 8.3 g/dL   Albumin 3.6  3.5 - 5.2 g/dL   AST 10  0 - 37 U/L   ALT 6  0 - 35 U/L   Alkaline Phosphatase 82  39 - 117 U/L   Total Bilirubin 0.3  0.3 - 1.2 mg/dL   GFR calc non Af Amer 31 (*) >90 mL/min   GFR calc Af Amer 36 (*) >90 mL/min  CBC WITH DIFFERENTIAL      Result Value Range   WBC 4.2  4.0 - 10.5 K/uL   RBC 3.97  3.87 - 5.11 MIL/uL   Hemoglobin 11.6 (*) 12.0 - 15.0 g/dL   HCT 16.1 (*) 09.6 - 04.5 %   MCV 87.9  78.0 - 100.0 fL   MCH 29.2  26.0 - 34.0 pg   MCHC 33.2  30.0 - 36.0 g/dL   RDW 40.9  81.1 - 91.4 %   Platelets 187  150 - 400 K/uL   Neutrophils Relative % 44  43 - 77 %   Neutro Abs 1.9  1.7 - 7.7 K/uL   Lymphocytes Relative 44  12 - 46 %   Lymphs Abs 1.9  0.7 - 4.0 K/uL   Monocytes Relative 8  3 - 12 %   Monocytes Absolute 0.4  0.1 - 1.0 K/uL   Eosinophils Relative 3  0 - 5 %   Eosinophils Absolute 0.1  0.0 - 0.7 K/uL   Basophils Relative 1  0 - 1 %   Basophils Absolute 0.0  0.0 - 0.1 K/uL  TSH      Result Value Range   TSH 0.734  0.350 - 4.500 uIU/mL  TROPONIN I      Result Value Range   Troponin I <0.30  <0.30 ng/mL  TROPONIN I      Result Value Range   Troponin I <0.30  <0.30 ng/mL  TROPONIN I      Result Value Range   Troponin I <0.30  <0.30 ng/mL  URINE MICROSCOPIC-ADD ON      Result Value Range   Squamous Epithelial / LPF FEW (*) RARE   WBC, UA 21-50  <3 WBC/hpf   RBC / HPF 0-2  <3 RBC/hpf   Bacteria, UA FEW (*) RARE  OCCULT BLOOD X 1 CARD TO LAB, STOOL      Result Value Range   Fecal Occult Bld NEGATIVE  NEGATIVE  BASIC METABOLIC PANEL      Result Value Range   Sodium 144  137 - 147 mEq/L   Potassium 3.9  3.7 - 5.3 mEq/L   Chloride 106  96 - 112 mEq/L   CO2 23  19 - 32 mEq/L   Glucose, Bld 79  70 - 99 mg/dL   BUN 19  6 - 23 mg/dL   Creatinine, Ser 7.82 (*) 0.50 - 1.10 mg/dL   Calcium 9.1  8.4 - 95.6 mg/dL   GFR calc non Af Amer 34 (*) >90 mL/min   GFR calc  Af Amer 39 (*) >90 mL/min  CBC WITH DIFFERENTIAL      Result Value Range   WBC 4.1  4.0 - 10.5 K/uL   RBC 3.83 (*) 3.87 - 5.11 MIL/uL   Hemoglobin 11.2 (*) 12.0 - 15.0 g/dL   HCT 21.3 (*) 08.6 - 57.8 %   MCV 87.5  78.0 - 100.0 fL   MCH 29.2  26.0 - 34.0 pg  MCHC 33.4  30.0 - 36.0 g/dL   RDW 31.5  17.6 - 16.0 %   Platelets 179  150 - 400 K/uL   Neutrophils Relative % 31 (*) 43 - 77 %   Neutro Abs 1.2 (*) 1.7 - 7.7 K/uL   Lymphocytes Relative 57 (*) 12 - 46 %   Lymphs Abs 2.3  0.7 - 4.0 K/uL   Monocytes Relative 8  3 - 12 %   Monocytes Absolute 0.3  0.1 - 1.0 K/uL   Eosinophils Relative 4  0 - 5 %   Eosinophils Absolute 0.2  0.0 - 0.7 K/uL   Basophils Relative 1  0 - 1 %   Basophils Absolute 0.0  0.0 - 0.1 K/uL  CG4 I-STAT (LACTIC ACID)      Result Value Range   Lactic Acid, Venous 1.43  0.5 - 2.2 mmol/L  POCT I-STAT TROPONIN I      Result Value Range   Troponin i, poc 0.01  0.00 - 0.08 ng/mL   Comment 3             Time coordinating discharge: 35 minutes  Signed: Pearla Dubonnet, MD 10/19/2013, 8:05 AM

## 2013-10-19 NOTE — Care Management Note (Signed)
    Page 1 of 1   10/19/2013     10:26:21 AM   CARE MANAGEMENT NOTE 10/19/2013  Patient:  Emily Schultz, Emily Schultz   Account Number:  [id number]  Date Initiated:  10/19/2013  Documentation initiated by:  GRAVES-BIGELOW,Cleora Karnik  Subjective/Objective Assessment:   Pt admitted with syncope. Pt has insurance - CM unable to assist with medication needs.     Action/Plan:   Anticipated DC Date:  10/19/2013   Anticipated DC Plan:  HOME/SELF CARE      DC Planning Services  CM consult      Choice offered to / List presented to:             Status of service:  Completed, signed off Medicare Important Message given?   (If response is "NO", the following Medicare IM given date fields will be blank) Date Medicare IM given:   Date Additional Medicare IM given:    Discharge Disposition:  HOME/SELF CARE  Per UR Regulation:  Reviewed for med. necessity/level of care/duration of stay  If discussed at Long Length of Stay Meetings, dates discussed:    Comments:

## 2013-10-19 NOTE — Telephone Encounter (Signed)
[facility] # [phone] called and stated that patient needs refills. Informed her that i have tried calling patient to schedule an appointment because she needs to be seen. Pharmacist stated that she would contact daughter and let her know because patient is in hospital.

## 2013-10-25 DIAGNOSIS — I2510 Atherosclerotic heart disease of native coronary artery without angina pectoris: Secondary | ICD-10-CM | POA: Diagnosis not present

## 2013-10-25 DIAGNOSIS — K5900 Constipation, unspecified: Secondary | ICD-10-CM | POA: Diagnosis not present

## 2013-10-25 DIAGNOSIS — B0229 Other postherpetic nervous system involvement: Secondary | ICD-10-CM | POA: Diagnosis not present

## 2013-10-25 DIAGNOSIS — R1033 Periumbilical pain: Secondary | ICD-10-CM | POA: Diagnosis not present

## 2013-11-06 ENCOUNTER — Other Ambulatory Visit: Payer: Self-pay | Admitting: Internal Medicine

## 2013-11-22 DIAGNOSIS — B0229 Other postherpetic nervous system involvement: Secondary | ICD-10-CM | POA: Diagnosis not present

## 2013-11-22 DIAGNOSIS — K5900 Constipation, unspecified: Secondary | ICD-10-CM | POA: Diagnosis not present

## 2013-11-22 DIAGNOSIS — I2510 Atherosclerotic heart disease of native coronary artery without angina pectoris: Secondary | ICD-10-CM | POA: Diagnosis not present

## 2013-11-22 DIAGNOSIS — I10 Essential (primary) hypertension: Secondary | ICD-10-CM | POA: Diagnosis not present

## 2013-11-22 DIAGNOSIS — R1033 Periumbilical pain: Secondary | ICD-10-CM | POA: Diagnosis not present

## 2013-12-06 ENCOUNTER — Other Ambulatory Visit: Payer: Self-pay | Admitting: *Deleted

## 2013-12-06 MED ORDER — RANITIDINE HCL 300 MG PO TABS: 300.0000 mg | ORAL_TABLET | Freq: Every evening | ORAL | Status: DC

## 2013-12-06 NOTE — Telephone Encounter (Signed)
[facility] 

## 2013-12-20 ENCOUNTER — Ambulatory Visit: Payer: Medicare Other | Admitting: Podiatry

## 2014-01-01 ENCOUNTER — Other Ambulatory Visit: Payer: Self-pay | Admitting: Internal Medicine

## 2014-01-03 ENCOUNTER — Ambulatory Visit (INDEPENDENT_AMBULATORY_CARE_PROVIDER_SITE_OTHER): Payer: Medicare Other | Admitting: Podiatry

## 2014-01-03 ENCOUNTER — Encounter: Payer: Self-pay | Admitting: Podiatry

## 2014-01-03 VITALS — BP 130/90 | HR 72 | Resp 18

## 2014-01-03 DIAGNOSIS — L84 Corns and callosities: Secondary | ICD-10-CM | POA: Diagnosis not present

## 2014-01-03 DIAGNOSIS — M79609 Pain in unspecified limb: Secondary | ICD-10-CM | POA: Diagnosis not present

## 2014-01-03 DIAGNOSIS — B351 Tinea unguium: Secondary | ICD-10-CM | POA: Diagnosis not present

## 2014-01-03 NOTE — Telephone Encounter (Signed)
Dr.Reed please advise on contraindications

## 2014-01-03 NOTE — Telephone Encounter (Signed)
Needs appt

## 2014-01-03 NOTE — Telephone Encounter (Signed)
I spoke with patient's daughter to schedule appointment. Patient's daughter sated she does not need rx right now and she will call once rx is needed to schedule appointment. I informed her that she needs to come in to discuss:Contraindication Interaction, patient 's daughter will call back to schedule appointment

## 2014-01-04 NOTE — Progress Notes (Signed)
Patient ID: Emily Schultz, female   DOB: [birth year], 80 y.o.   MRN: [id number]  Subjective: Orientated x48 80 year old black female request debridement of painful toenails and keratoses.The last visit for this service was 03/22/2013. She has been a patient in this practice since 02/20/2012.   Objective:  Dermatological: Hypertrophic, discolored, elongated toenails x10. Keratoses fourth right webspace.  Assessment:   Symptomatic onychomycoses x10  Keratoses x1  Plan: Nails x10 and keratoses x1 debrided without a bleeding. Reappoint at three-month intervals. Dispensed a silicone toe wedge

## 2014-01-27 ENCOUNTER — Other Ambulatory Visit: Payer: Self-pay | Admitting: Internal Medicine

## 2014-01-27 NOTE — Telephone Encounter (Signed)
Left message on VM for patient to return call when available. Reason for call- ? If patient plans on returning to see Dr.Reed, appointment was due in 10/2013

## 2014-01-28 NOTE — Telephone Encounter (Signed)
Spoke with patient's daughter, Dr.Reed is still patient's PCP. Patient and daughter were out town at the time of call, patient's daughter will call to schedule appointment when she returns

## 2014-03-07 ENCOUNTER — Encounter: Payer: Self-pay | Admitting: Podiatry

## 2014-03-07 ENCOUNTER — Ambulatory Visit (INDEPENDENT_AMBULATORY_CARE_PROVIDER_SITE_OTHER): Payer: Medicare Other | Admitting: Podiatry

## 2014-03-07 VITALS — BP 156/88 | HR 74 | Resp 18

## 2014-03-07 DIAGNOSIS — B351 Tinea unguium: Secondary | ICD-10-CM | POA: Diagnosis not present

## 2014-03-07 DIAGNOSIS — M79609 Pain in unspecified limb: Secondary | ICD-10-CM | POA: Diagnosis not present

## 2014-03-07 DIAGNOSIS — L84 Corns and callosities: Secondary | ICD-10-CM | POA: Diagnosis not present

## 2014-03-07 DIAGNOSIS — M79673 Pain in unspecified foot: Secondary | ICD-10-CM

## 2014-03-08 NOTE — Progress Notes (Signed)
Patient ID: Emily Schultz, female   DOB: [birth year], 80 y.o.   MRN: [id number]  Subjective: Orientated x3 black female recess today complaining of painful toenails and a painful keratoses in the fourth right with space area.  Objective: Brittle, elongated, discolored, hypertrophic toenails x10 Keratoses fourth right with space  Assessment: Symptomatic onychomycoses 6-10 Keratoses x1  Plan: Nails x10 and keratoses x1 debrided without a bleeding Dispensed silicone toe wedge to insert between fourth and fifth right toes   Reappoint x3 months

## 2014-04-04 ENCOUNTER — Ambulatory Visit: Payer: Medicare Other | Admitting: Podiatry

## 2014-04-25 ENCOUNTER — Encounter: Payer: Self-pay | Admitting: Internal Medicine

## 2014-04-25 ENCOUNTER — Ambulatory Visit (INDEPENDENT_AMBULATORY_CARE_PROVIDER_SITE_OTHER): Payer: Medicare Other | Admitting: Internal Medicine

## 2014-04-25 VITALS — BP 170/86 | HR 58 | Temp 98.2°F | Wt 149.8 lb

## 2014-04-25 DIAGNOSIS — M109 Gout, unspecified: Secondary | ICD-10-CM | POA: Diagnosis not present

## 2014-04-25 DIAGNOSIS — I209 Angina pectoris, unspecified: Secondary | ICD-10-CM | POA: Diagnosis not present

## 2014-04-25 DIAGNOSIS — I2510 Atherosclerotic heart disease of native coronary artery without angina pectoris: Secondary | ICD-10-CM | POA: Diagnosis not present

## 2014-04-25 DIAGNOSIS — R569 Unspecified convulsions: Secondary | ICD-10-CM | POA: Diagnosis not present

## 2014-04-25 DIAGNOSIS — F0150 Vascular dementia without behavioral disturbance: Secondary | ICD-10-CM | POA: Diagnosis not present

## 2014-04-25 DIAGNOSIS — M899 Disorder of bone, unspecified: Secondary | ICD-10-CM | POA: Diagnosis not present

## 2014-04-25 DIAGNOSIS — F01518 Vascular dementia, unspecified severity, with other behavioral disturbance: Secondary | ICD-10-CM

## 2014-04-25 DIAGNOSIS — F0151 Vascular dementia with behavioral disturbance: Secondary | ICD-10-CM

## 2014-04-25 DIAGNOSIS — M949 Disorder of cartilage, unspecified: Secondary | ICD-10-CM

## 2014-04-25 DIAGNOSIS — I25119 Atherosclerotic heart disease of native coronary artery with unspecified angina pectoris: Secondary | ICD-10-CM

## 2014-04-25 DIAGNOSIS — M8580 Other specified disorders of bone density and structure, unspecified site: Secondary | ICD-10-CM

## 2014-04-25 MED ORDER — MEMANTINE HCL-DONEPEZIL HCL ER 28-10 MG PO CP24: 1.0000 | ORAL_CAPSULE | Freq: Every day | ORAL | Status: DC

## 2014-04-25 NOTE — Progress Notes (Signed)
Failed clock drawing  

## 2014-04-25 NOTE — Progress Notes (Signed)
Patient ID: Emily Schultz, female   DOB: Mar 11, 1922, 80 y.o.   MRN: [id number]   Location:  [facility] [place] [facility]  Code Status: does not have living will, health care power of attorney--form provided to her daughter today  No Known Allergies  Chief Complaint  Patient presents with  . Acute Visit    cough/tickle in throat x 2 weeks, BP elevated 170/110 &170/102 yesterday, progressive memory loss & combative at times.  Marland Kitchen other    neg for fall screening & depression screening, delcoines Pneumo/Tdap and shingles vaccine  . Medical Management of Chronic Issues    medications, no refills    HPI: Patient is a 80 y.o. black female seen in the office today for medical mgt of chronic diseases and acute visit due to cough.  Her memory has been worsening and she's been combative at times.  I previously diagnosed her with vascular dementia.  She was supposed to f/u in 11/14, but did not come until now for her annual physical, but this is not a physical slot.  BP is very high today.  May have had too much pork, bacon, sausage, and they are cutting back on that and consuming a lot of these and not enough water.  Not cooking for herself--one of her other daughters is cooking the evening meals.    Pt says she has gotten a year older.  Some trouble with coughing when swallowing when sitting around.  Has been busy washing and folding clothes today and she's been coughing while doing that.  She lost her money somewhere in her room.  She thinks people are taking it sometimes.      Pt doesn't think her memory is worse considering her age, but her children do.  Her daughter gives her her medications--sometimes evening pills get forgotten.  Did not take bp meds last evening.    No pain lately.  Does complain of postherpetic neuralgia--takes gabapentin for it.    Does c/o swallowing.  Might do better taking some meds in coffee or liquids.    Scored 11/30 on MMSE today and failed  clock while on aricept alone.  Review of Systems:  Review of Systems  Constitutional: Negative for fever, chills and malaise/fatigue.  HENT: Negative for congestion.   Eyes: Negative for blurred vision.       Glasses  Respiratory: Negative for shortness of breath.   Cardiovascular: Negative for chest pain and leg swelling.  Gastrointestinal: Negative for constipation, blood in stool and melena.  Genitourinary: Negative for dysuria.  Musculoskeletal: Positive for joint pain. Negative for falls and myalgias.  Neurological: Negative for dizziness, loss of consciousness, weakness and headaches.  Endo/Heme/Allergies: Does not bruise/bleed easily.  Psychiatric/Behavioral: Positive for memory loss.       Moody per daughter    Past Medical History  Diagnosis Date  . Hyperlipidemia LDL goal < 100   . Gout, unspecified   . Tension headache   . Unspecified essential hypertension   . Coronary atherosclerosis of native coronary artery   . Pain in joint, site unspecified   . Osteopenia   . Other malaise and fatigue   . Chest pain, unspecified   . Cataract 2010  . Vascular dementia     Past Surgical History  Procedure Laterality Date  . Spine surgery  [phone]  . Tubal ligation  1960  . Cardiac catheterization      Social History:   reports that she has quit smoking. She has  never used smokeless tobacco. She reports that she does not drink alcohol or use illicit drugs.  Family History  Problem Relation Age of Onset  . Stroke Mother   . Other Neg Hx     Medications: Patient's Medications  New Prescriptions   No medications on file  Previous Medications   AMLODIPINE (NORVASC) 5 MG TABLET    Take 5 mg by mouth daily.     ASPIRIN EC 81 MG TABLET    Take 81 mg by mouth at bedtime.     CARBAMAZEPINE (CARBATROL) 100 MG 12 HR CAPSULE    Take 100 mg by mouth 2 (two) times daily.   CHOLECALCIFEROL (VITAMIN D) 1000 UNITS TABLET    Take 2,000 Units by mouth daily.    CLOPIDOGREL  (PLAVIX) 75 MG TABLET    Take 1 tablet (75 mg total) by mouth daily.   DONEPEZIL (ARICEPT) 10 MG TABLET    TAKE ONE TABLET BY MOUTH EVERY DAY TO PRESERVE MEMORY.   FISH OIL-OMEGA-3 FATTY ACIDS 1000 MG CAPSULE    Take 1,000 mg by mouth 2 (two) times daily.    GABAPENTIN (NEURONTIN) 300 MG CAPSULE    Take 300 mg by mouth 3 (three) times daily.    HYDROCODONE-ACETAMINOPHEN (NORCO/VICODIN) 5-325 MG PER TABLET    Take 1 tablet by mouth every 6 (six) hours as needed for moderate pain.    LACTOBACILLUS RHAMNOSUS, GG, (CULTURELLE) CAPS    Take 1 capsule by mouth daily.    LORATADINE (CLARITIN) 10 MG TABLET    Take 10 mg by mouth daily as needed for allergies.    LOSARTAN (COZAAR) 50 MG TABLET    Take 50 mg by mouth daily.    METOPROLOL SUCCINATE (TOPROL-XL) 25 MG 24 HR TABLET    TAKE ONE TABLET BY MOUTH EVERY DAY FOR BLOOD PRESSURE WHEN SBP IS GREATER THAN 150.   MISC. DEVICES (ROLLATOR) MISC    Patient needs for unsteady gait, h/o fall, dementia   NITROGLYCERIN (NITROSTAT) 0.4 MG SL TABLET    Place 0.4 mg under the tongue every 5 (five) minutes as needed for chest pain. Place one under tongue at onset of chest pain. Repeat every 5 minutes up to twice more if needed.   PANTOPRAZOLE (PROTONIX) 40 MG TABLET    Take 40 mg by mouth daily.    RANEXA 500 MG 12 HR TABLET    TAKE ONE TABLET BY MOUTH TWICE DAILY   RANITIDINE (ZANTAC) 300 MG TABLET    TAKE ONE TABLET BY MOUTH IN THE EVENING   SIMVASTATIN (ZOCOR) 20 MG TABLET    TAKE ONE TABLET BY MOUTH EVERY DAY   TRAMADOL HCL 50 MG TBDP    Take 50 mg by mouth 3 (three) times daily. Take 1 tablet by mouth three times daily as needed for severe pain.   TRIAMTERENE-HYDROCHLOROTHIAZIDE (DYAZIDE) 37.5-25 MG PER CAPSULE    Take 1 each (1 capsule total) by mouth 3 (three) times a week. Take 1 tablet once daily for blood pressure.  Modified Medications   No medications on file  Discontinued Medications   ACIDOPHILUS (RISAQUAD) CAPS    Take 1 capsule by mouth daily.      ALPHA-D-GALACTOSIDASE (BEANO PO)    Take 2 tablets by mouth daily as needed (gas).   RANOLAZINE (RANEXA) 500 MG 12 HR TABLET    Take 500 mg by mouth daily.     Physical Exam: Filed Vitals:   04/25/14 1550  BP: 170/86  Pulse: 58  Temp: 98.2 F (36.8 C)  TempSrc: Oral  Weight: 149 lb 12.8 oz (67.949 kg)  SpO2: 97%  Physical Exam  Constitutional: She appears well-developed and well-nourished. No distress.  HENT:  Head: Normocephalic and atraumatic.  Cardiovascular: Normal rate, regular rhythm, normal heart sounds and intact distal pulses.   Pulmonary/Chest: Effort normal and breath sounds normal. No respiratory distress.  Abdominal: Soft. Bowel sounds are normal. She exhibits no distension and no mass. There is no tenderness.  Musculoskeletal: Normal range of motion.  Neurological: She is alert.  Oriented to person and place  Skin: Skin is warm and dry.  Psychiatric: She has a normal mood and affect. Her behavior is normal.    Labs reviewed: Basic Metabolic Panel:  Recent Labs  16/06/9609/30/14 1925 10/17/13 1635 10/18/13 0610 10/19/13 0427  NA 143 138 143 144  K 4.4 3.7 3.9 3.9  CL 104 98 105 106  CO2 28 24 25 23   GLUCOSE 93 111* 78 79  BUN 19 23 21 19   CREATININE 1.30* 1.83* 1.44* 1.35*  CALCIUM 10.1 9.6 9.3 9.1  MG  --  2.0  --   --   TSH  --   --  0.734  --    Liver Function Tests:  Recent Labs  10/17/13 1635 10/18/13 0610  AST 13 10  ALT 7 6  ALKPHOS 91 82  BILITOT 0.2* 0.3  PROT 7.7 7.0  ALBUMIN 4.0 3.6  CBC:  Recent Labs  10/17/13 1635 10/18/13 0610 10/19/13 0427  WBC 4.6 4.2 4.1  NEUTROABS 2.3 1.9 1.2*  HGB 12.8 11.6* 11.2*  HCT 37.9 34.9* 33.5*  MCV 88.1 87.9 87.5  PLT 200 187 179   Lipid Panel:  Recent Labs  05/03/13 0929  HDL 89  LDLCALC 73  TRIG 76  CHOLHDL 2.0    Assessment/Plan 1. Vascular dementia with behavioral disturbance - will change her over to namzaric due to need for both aricept and namenda and to reduce pill  burden - Memantine HCl-Donepezil HCl (NAMZARIC) 28-10 MG CP24; Take 1 capsule by mouth daily. For dementia (may open and put in food or drink)  Dispense: 30 capsule; Refill: 3  2. Atherosclerosis of native coronary artery of native heart with angina pectoris - has not had any recent angina or other related symptoms - CBC With differential/Platelet - Basic metabolic panel  3. Gout, unspecified - no recent flares, not on any regular medication to prevent this and is on dyazide--if recurs, would stop dyazide and put her on a different bp med - Uric Acid  4. Osteopenia - cont vitamin D supplement   5. Seizures - cont tegretol and make sure levels are therapeutic - Carbamazepine level, free  Labs/tests ordered: Orders Placed This Encounter  Procedures  . Carbamazepine level, free  . CBC With differential/Platelet  . Basic metabolic panel  . Uric Acid    Next appt:  6 mos annual exam

## 2014-04-25 NOTE — Patient Instructions (Addendum)
Let me know if blood pressures are remaining over 150/90 in which case I will change your medicines.    Please complete your living will and health care power of attorney paperwork and bring [place] a copy for your chart.

## 2014-04-27 LAB — CBC WITH DIFFERENTIAL
Basophils Absolute: 0 10*3/uL (ref 0.0–0.2)
Basos: 1 %
Eos: 4 %
Eosinophils Absolute: 0.2 10*3/uL (ref 0.0–0.4)
HCT: 36.7 % (ref 34.0–46.6)
Hemoglobin: 11.9 g/dL (ref 11.1–15.9)
Immature Grans (Abs): 0 10*3/uL (ref 0.0–0.1)
Immature Granulocytes: 0 %
Lymphocytes Absolute: 2 10*3/uL (ref 0.7–3.1)
Lymphs: 47 %
MCH: 29 pg (ref 26.6–33.0)
MCHC: 32.4 g/dL (ref 31.5–35.7)
MCV: 89 fL (ref 79–97)
Monocytes Absolute: 0.3 10*3/uL (ref 0.1–0.9)
Monocytes: 7 %
Neutrophils Absolute: 1.7 10*3/uL (ref 1.4–7.0)
Neutrophils Relative %: 41 %
Platelets: 246 10*3/uL (ref 150–379)
RBC: 4.11 x10E6/uL (ref 3.77–5.28)
RDW: 15.3 % (ref 12.3–15.4)
WBC: 4.1 10*3/uL (ref 3.4–10.8)

## 2014-04-27 LAB — BASIC METABOLIC PANEL
BUN/Creatinine Ratio: 12 (ref 11–26)
BUN: 15 mg/dL (ref 10–36)
CO2: 24 mmol/L (ref 18–29)
Calcium: 9.8 mg/dL (ref 8.7–10.3)
Chloride: 100 mmol/L (ref 97–108)
Creatinine, Ser: 1.23 mg/dL — ABNORMAL HIGH (ref 0.57–1.00)
GFR calc Af Amer: 45 mL/min/{1.73_m2} — ABNORMAL LOW (ref >59)
GFR calc non Af Amer: 39 mL/min/{1.73_m2} — ABNORMAL LOW (ref >59)
Glucose: 88 mg/dL (ref 65–99)
Potassium: 4.2 mmol/L (ref 3.5–5.2)
Sodium: 143 mmol/L (ref 134–144)

## 2014-04-27 LAB — CARBAMAZEPINE, FREE AND TOTAL: Carbamazepine, Free: 1.6 ug/mL (ref 0.6–4.2)

## 2014-04-27 LAB — URIC ACID: Uric Acid: 5.5 mg/dL (ref 2.5–7.1)

## 2014-05-02 ENCOUNTER — Encounter: Payer: Self-pay | Admitting: Internal Medicine

## 2014-05-09 ENCOUNTER — Ambulatory Visit (INDEPENDENT_AMBULATORY_CARE_PROVIDER_SITE_OTHER): Payer: Medicare Other | Admitting: Podiatry

## 2014-05-09 DIAGNOSIS — B351 Tinea unguium: Secondary | ICD-10-CM | POA: Diagnosis not present

## 2014-05-09 DIAGNOSIS — L84 Corns and callosities: Secondary | ICD-10-CM | POA: Diagnosis not present

## 2014-05-09 DIAGNOSIS — M79609 Pain in unspecified limb: Secondary | ICD-10-CM | POA: Diagnosis not present

## 2014-05-09 DIAGNOSIS — M79673 Pain in unspecified foot: Secondary | ICD-10-CM

## 2014-05-10 NOTE — Progress Notes (Signed)
Patient ID: Emily Schultz, female   DOB: [birth year], 80 y.o.   MRN: [id number]  Subjective: This patient presents today complaining of painful toenails and a painful keratoses in the fourth right webspace area  Objective: The toenails are hypertrophic, elongated, discolored 6-10 Keratoses fourth right webspace  Assessment: Symptomatic onychomycoses 6-10 Keratoses x1  Plan: Nails x10 and keratoses x1 debrided without any bleeding  Reappoint x3 months

## 2014-05-31 DIAGNOSIS — B0229 Other postherpetic nervous system involvement: Secondary | ICD-10-CM | POA: Diagnosis not present

## 2014-05-31 DIAGNOSIS — B0223 Postherpetic polyneuropathy: Secondary | ICD-10-CM | POA: Diagnosis not present

## 2014-05-31 DIAGNOSIS — I10 Essential (primary) hypertension: Secondary | ICD-10-CM | POA: Diagnosis not present

## 2014-06-09 ENCOUNTER — Emergency Department (HOSPITAL_BASED_OUTPATIENT_CLINIC_OR_DEPARTMENT_OTHER)
Admission: EM | Admit: 2014-06-09 | Discharge: 2014-06-09 | Disposition: A | Discharge status: Home / Self Care | Payer: Medicare Other | Attending: Emergency Medicine | Admitting: Emergency Medicine

## 2014-06-09 ENCOUNTER — Encounter (HOSPITAL_BASED_OUTPATIENT_CLINIC_OR_DEPARTMENT_OTHER): Payer: Self-pay | Admitting: Emergency Medicine

## 2014-06-09 ENCOUNTER — Emergency Department (HOSPITAL_BASED_OUTPATIENT_CLINIC_OR_DEPARTMENT_OTHER): Payer: Medicare Other

## 2014-06-09 DIAGNOSIS — Z8669 Personal history of other diseases of the nervous system and sense organs: Secondary | ICD-10-CM | POA: Diagnosis not present

## 2014-06-09 DIAGNOSIS — Z79899 Other long term (current) drug therapy: Secondary | ICD-10-CM | POA: Diagnosis not present

## 2014-06-09 DIAGNOSIS — Z9889 Other specified postprocedural states: Secondary | ICD-10-CM | POA: Diagnosis not present

## 2014-06-09 DIAGNOSIS — Z87891 Personal history of nicotine dependence: Secondary | ICD-10-CM | POA: Diagnosis not present

## 2014-06-09 DIAGNOSIS — I2510 Atherosclerotic heart disease of native coronary artery without angina pectoris: Secondary | ICD-10-CM | POA: Diagnosis not present

## 2014-06-09 DIAGNOSIS — Z8739 Personal history of other diseases of the musculoskeletal system and connective tissue: Secondary | ICD-10-CM | POA: Diagnosis not present

## 2014-06-09 DIAGNOSIS — S3992XA Unspecified injury of lower back, initial encounter: Secondary | ICD-10-CM | POA: Diagnosis not present

## 2014-06-09 DIAGNOSIS — F0150 Vascular dementia without behavioral disturbance: Secondary | ICD-10-CM | POA: Diagnosis not present

## 2014-06-09 DIAGNOSIS — I10 Essential (primary) hypertension: Secondary | ICD-10-CM | POA: Diagnosis not present

## 2014-06-09 DIAGNOSIS — M545 Low back pain: Secondary | ICD-10-CM | POA: Diagnosis not present

## 2014-06-09 DIAGNOSIS — Z7902 Long term (current) use of antithrombotics/antiplatelets: Secondary | ICD-10-CM | POA: Diagnosis not present

## 2014-06-09 DIAGNOSIS — E785 Hyperlipidemia, unspecified: Secondary | ICD-10-CM | POA: Diagnosis not present

## 2014-06-09 DIAGNOSIS — M549 Dorsalgia, unspecified: Secondary | ICD-10-CM | POA: Diagnosis present

## 2014-06-09 MED ORDER — HYDROCODONE-ACETAMINOPHEN 5-325 MG PO TABS
2.0000 | ORAL_TABLET | ORAL | Status: DC | PRN
Start: 2014-06-09 — End: 2015-10-12

## 2014-06-09 MED ORDER — HYDROCODONE-ACETAMINOPHEN 5-325 MG PO TABS
2.0000 | ORAL_TABLET | Freq: Once | ORAL | Status: AC
  Administered 2014-06-09: 2 via ORAL
  Filled 2014-06-09: qty 2

## 2014-06-09 NOTE — ED Notes (Signed)
Pt to Ed co lower back pain(right side). Pt reports falling on 9/21 yet she did not seek medical consultation for it. Pt hx of HTn and is on Aspirin q 2 days 81 mg  PO at bedtime. Pt ambulates without assistance.

## 2014-06-09 NOTE — ED Provider Notes (Signed)
CSN: [id number]     Arrival date & time 06/09/14  1459 History   First MD Initiated Contact with Patient 06/09/14 1516     Chief Complaint  Patient presents with  . Back Pain    right lower side     (Consider location/radiation/quality/duration/timing/severity/associated sxs/prior Treatment) Patient is a 80 y.o. female presenting with back pain. The history is provided by the patient. No language interpreter was used.  Back Pain Location:  Lumbar spine Quality:  Aching Radiates to:  Does not radiate Pain severity:  Moderate Pain is:  Same all the time Onset quality:  Sudden Duration:  1 week Timing:  Constant Progression:  Worsening Chronicity:  New Relieved by:  Nothing Worsened by:  Nothing tried Ineffective treatments:  None tried Associated symptoms: no leg pain     Past Medical History  Diagnosis Date  . Hyperlipidemia LDL goal < 100   . Gout, unspecified   . Tension headache   . Unspecified essential hypertension   . Coronary atherosclerosis of native coronary artery   . Pain in joint, site unspecified   . Osteopenia   . Other malaise and fatigue   . Chest pain, unspecified   . Cataract 2010  . Vascular dementia    Past Surgical History  Procedure Laterality Date  . Spine surgery  [phone]  . Tubal ligation  1960  . Cardiac catheterization     Family History  Problem Relation Age of Onset  . Stroke Mother   . Other Neg Hx    History  Substance Use Topics  . Smoking status: Former Games developermoker  . Smokeless tobacco: Never Used  . Alcohol Use: No   OB History   Grav Para Term Preterm Abortions TAB SAB Ect Mult Living                 Review of Systems  Musculoskeletal: Positive for back pain.  All other systems reviewed and are negative.     Allergies  Review of patient's allergies indicates no known allergies.  Home Medications   Prior to Admission medications   Medication Sig Start Date End Date Taking? Authorizing Provider  amLODipine  (NORVASC) 5 MG tablet Take 5 mg by mouth daily.      Historical Provider, MD  aspirin EC 81 MG tablet Take 81 mg by mouth at bedtime.      Historical Provider, MD  carbamazepine (CARBATROL) 100 MG 12 hr capsule Take 100 mg by mouth 2 (two) times daily.    Historical Provider, MD  cholecalciferol (VITAMIN D) 1000 UNITS tablet Take 2,000 Units by mouth daily.     Historical Provider, MD  clopidogrel (PLAVIX) 75 MG tablet Take 1 tablet (75 mg total) by mouth daily. 07/09/13   Kimber RelicArthur G Green, MD  fish oil-omega-3 fatty acids 1000 MG capsule Take 1,000 mg by mouth 2 (two) times daily.     Historical Provider, MD  gabapentin (NEURONTIN) 300 MG capsule Take 300 mg by mouth 3 (three) times daily.  10/12/13   Historical Provider, MD  HYDROcodone-acetaminophen (NORCO/VICODIN) 5-325 MG per tablet Take 1 tablet by mouth every 6 (six) hours as needed for moderate pain.  09/13/13   Historical Provider, MD  Lactobacillus Rhamnosus, GG, (CULTURELLE) CAPS Take 1 capsule by mouth daily.     Historical Provider, MD  loratadine (CLARITIN) 10 MG tablet Take 10 mg by mouth daily as needed for allergies.     Historical Provider, MD  losartan (COZAAR) 50 MG tablet Take 50  mg by mouth daily.  09/28/13   Historical Provider, MD  Memantine HCl-Donepezil HCl (NAMZARIC) 28-10 MG CP24 Take 1 capsule by mouth daily. For dementia (may open and put in food or drink) 04/25/14   Tiffany L Reed, DO  metoprolol succinate (TOPROL-XL) 25 MG 24 hr tablet TAKE ONE TABLET BY MOUTH EVERY DAY FOR BLOOD PRESSURE WHEN SBP IS GREATER THAN 150.    Kermit Balo, DO  Misc. Devices (ROLLATOR) MISC Patient needs for unsteady gait, h/o fall, dementia 04/26/13   Tiffany L Reed, DO  nitroGLYCERIN (NITROSTAT) 0.4 MG SL tablet Place 0.4 mg under the tongue every 5 (five) minutes as needed for chest pain. Place one under tongue at onset of chest pain. Repeat every 5 minutes up to twice more if needed.    Historical Provider, MD  pantoprazole (PROTONIX) 40 MG  tablet Take 40 mg by mouth daily.  09/28/13   Historical Provider, MD  RANEXA 500 MG 12 hr tablet TAKE ONE TABLET BY MOUTH TWICE DAILY    Tiffany L Reed, DO  ranitidine (ZANTAC) 300 MG tablet TAKE ONE TABLET BY MOUTH IN THE EVENING    Tiffany L Reed, DO  simvastatin (ZOCOR) 20 MG tablet TAKE ONE TABLET BY MOUTH EVERY DAY    Tiffany L Reed, DO  TraMADol HCl 50 MG TBDP Take 50 mg by mouth 3 (three) times daily. Take 1 tablet by mouth three times daily as needed for severe pain. 10/19/13   Marden Noble, MD  triamterene-hydrochlorothiazide (DYAZIDE) 37.5-25 MG per capsule Take 1 each (1 capsule total) by mouth 3 (three) times a week. Take 1 tablet once daily for blood pressure. 10/19/13   Marden Noble, MD   BP 174/83  Pulse 66  Temp(Src) 98 F (36.7 C) (Oral)  Resp 18  Ht 5\' 4"  (1.626 m)  Wt 157 lb (71.215 kg)  BMI 26.94 kg/m2  SpO2 97% Physical Exam  Nursing note and vitals reviewed. Constitutional: She is oriented to person, place, and time. She appears well-developed and well-nourished.  HENT:  Head: Normocephalic.  Eyes: Conjunctivae and EOM are normal. Pupils are equal, round, and reactive to light.  Neck: Normal range of motion.  Cardiovascular: Normal rate and normal heart sounds.   Pulmonary/Chest: Effort normal.  Abdominal: She exhibits no distension.  Musculoskeletal:  Tender ls spine decreased range of motion,  Pelvis tender lower back pelvic area  Neurological: She is alert and oriented to person, place, and time.  Skin: Skin is warm.  Psychiatric: She has a normal mood and affect.    ED Course  Procedures (including critical care time) Labs Review Labs Reviewed - No data to display  Imaging Review Dg Lumbar Spine Complete  06/09/2014   CLINICAL DATA:  Low back pain following a fall.  EXAM: LUMBAR SPINE - COMPLETE 4+ VIEW  COMPARISON:  Abdomen and pelvis CT dated 09/17/2012.  FINDINGS: Previously demonstrated calcified periportal lymph nodes. Atheromatous arterial  calcifications. Lumbar spine degenerative and postsurgical changes including bone fusion in the lower lumbar spine and lumbosacral junction. Stable grade 1 anterolisthesis at the L3-4 level. No fractures or acute subluxations. Previously noted L1 vertebral hemangioma with a stable mild compression deformity at that level.  IMPRESSION: 1. No acute abnormality. 2. Stable degenerative and postoperative changes.   Electronically Signed   By: Gordan Payment M.D.   On: 06/09/2014 16:08   Dg Pelvis 1-2 Views  06/09/2014   CLINICAL DATA:  Low back pain following a fall last week.  EXAM: PELVIS - 1-2 VIEW  COMPARISON:  Pelvis CT dated 09/17/2012.  FINDINGS: There is no evidence of pelvic fracture or diastasis. No pelvic bone lesions are seen.  IMPRESSION: No fracture or dislocation.   Electronically Signed   By: Gordan Payment M.D.   On: 06/09/2014 16:04     EKG Interpretation None      MDM   Final diagnoses:  Low back pain without sciatica, unspecified back pain laterality    Pt given 2 hydrocodone for pain.   Xrays no fracture.  Pt able to ambulate.   Pt given rx for hydrocodone.  I advised see Dr. Kevan Ny for recheck    Elson Areas, PA-C 06/09/14 1655

## 2014-06-09 NOTE — Discharge Instructions (Signed)
Back Pain, Adult Low back pain is very common. About 1 in 5 people have back pain.The cause of low back pain is rarely dangerous. The pain often gets better over time.About half of people with a sudden onset of back pain feel better in just 2 weeks. About 8 in 10 people feel better by 6 weeks.  CAUSES Some common causes of back pain include:  Strain of the muscles or ligaments supporting the spine.  Wear and tear (degeneration) of the spinal discs.  Arthritis.  Direct injury to the back. DIAGNOSIS Most of the time, the direct cause of low back pain is not known.However, back pain can be treated effectively even when the exact cause of the pain is unknown.Answering your caregiver's questions about your overall health and symptoms is one of the most accurate ways to make sure the cause of your pain is not dangerous. If your caregiver needs more information, he or she may order lab work or imaging tests (X-rays or MRIs).However, even if imaging tests show changes in your back, this usually does not require surgery. HOME CARE INSTRUCTIONS For many people, back pain returns.Since low back pain is rarely dangerous, it is often a condition that people can learn to manageon their own.   Remain active. It is stressful on the back to sit or stand in one place. Do not sit, drive, or stand in one place for more than 30 minutes at a time. Take short walks on level surfaces as soon as pain allows.Try to increase the length of time you walk each day.  Do not stay in bed.Resting more than 1 or 2 days can delay your recovery.  Do not avoid exercise or work.Your body is made to move.It is not dangerous to be active, even though your back may hurt.Your back will likely heal faster if you return to being active before your pain is gone.  Pay attention to your body when you bend and lift. Many people have less discomfortwhen lifting if they bend their knees, keep the load close to their bodies,and  avoid twisting. Often, the most comfortable positions are those that put less stress on your recovering back.  Find a comfortable position to sleep. Use a firm mattress and lie on your side with your knees slightly bent. If you lie on your back, put a pillow under your knees.  Only take over-the-counter or prescription medicines as directed by your caregiver. Over-the-counter medicines to reduce pain and inflammation are often the most helpful.Your caregiver may prescribe muscle relaxant drugs.These medicines help dull your pain so you can more quickly return to your normal activities and healthy exercise.  Put ice on the injured area.  Put ice in a plastic bag.  Place a towel between your skin and the bag.  Leave the ice on for 15-20 minutes, 03-04 times a day for the first 2 to 3 days. After that, ice and heat may be alternated to reduce pain and spasms.  Ask your caregiver about trying back exercises and gentle massage. This may be of some benefit.  Avoid feeling anxious or stressed.Stress increases muscle tension and can worsen back pain.It is important to recognize when you are anxious or stressed and learn ways to manage it.Exercise is a great option. SEEK MEDICAL CARE IF:  You have pain that is not relieved with rest or medicine.  You have pain that does not improve in 1 week.  You have new symptoms.  You are generally not feeling well. SEEK   IMMEDIATE MEDICAL CARE IF:   You have pain that radiates from your back into your legs.  You develop new bowel or bladder control problems.  You have unusual weakness or numbness in your arms or legs.  You develop nausea or vomiting.  You develop abdominal pain.  You feel faint. Document Released: 08/26/2005 Document Revised: 02/25/2012 Document Reviewed: 12/28/2013 Exit[employer] Patient Information 2015 [employer]. This information is not intended to replace advice given to you by your health care provider. Make sure you  discuss any questions you have with your health care provider.  

## 2014-06-14 NOTE — ED Provider Notes (Signed)
Medical screening examination/treatment/procedure(s) were performed by non-physician practitioner and as supervising physician I was immediately available for consultation/collaboration.  Toy Cookey, MD 06/14/14 [phone]

## 2014-07-11 ENCOUNTER — Ambulatory Visit: Payer: Medicare Other | Admitting: Podiatry

## 2014-07-27 ENCOUNTER — Other Ambulatory Visit: Payer: Self-pay | Admitting: Internal Medicine

## 2014-09-01 ENCOUNTER — Ambulatory Visit (INDEPENDENT_AMBULATORY_CARE_PROVIDER_SITE_OTHER): Payer: Medicare Other | Admitting: Internal Medicine

## 2014-09-01 ENCOUNTER — Encounter: Payer: Self-pay | Admitting: Internal Medicine

## 2014-09-01 VITALS — BP 158/100 | HR 66 | Temp 98.2°F | Resp 18 | Ht 60.0 in | Wt 151.2 lb

## 2014-09-01 DIAGNOSIS — F0280 Dementia in other diseases classified elsewhere without behavioral disturbance: Secondary | ICD-10-CM | POA: Diagnosis not present

## 2014-09-01 DIAGNOSIS — I25119 Atherosclerotic heart disease of native coronary artery with unspecified angina pectoris: Secondary | ICD-10-CM | POA: Diagnosis not present

## 2014-09-01 DIAGNOSIS — F329 Major depressive disorder, single episode, unspecified: Secondary | ICD-10-CM | POA: Diagnosis not present

## 2014-09-01 DIAGNOSIS — F0151 Vascular dementia with behavioral disturbance: Secondary | ICD-10-CM | POA: Diagnosis not present

## 2014-09-01 DIAGNOSIS — F0393 Unspecified dementia, unspecified severity, with mood disturbance: Secondary | ICD-10-CM

## 2014-09-01 DIAGNOSIS — F32A Depression, unspecified: Secondary | ICD-10-CM

## 2014-09-01 DIAGNOSIS — F01518 Vascular dementia, unspecified severity, with other behavioral disturbance: Secondary | ICD-10-CM

## 2014-09-01 MED ORDER — MIRTAZAPINE 7.5 MG PO TABS: 7.5000 mg | ORAL_TABLET | Freq: Every day | ORAL | Status: DC

## 2014-09-01 NOTE — Progress Notes (Signed)
Patient ID: Emily Schultz, female   DOB: Apr 23, 1922, 80 y.o.   MRN: [id number]   Location:  [facility] / [facility]  Code Status: DNR  No Known Allergies  Chief Complaint  Patient presents with  . Medical Management of Chronic Issues    HPI: Patient is a 80 y.o.  seen in the office today for med mgt--her daughter has several concerns she wants to discuss with me.  Pt's daughter is also taking care of her sister who has had cancer with multiple brain surgeries.  Pt is being combative with her and it is concerning due to balance problems.  She requests a sedative for her.  Takes what cancer pt says the wrong way and pt  gets upset and combative.    Does move around well.  Her daughter is trying to keep her at home.    Her daughter does think she is depressed--pt obsessed with going back home.  Appetite is great.  Does not sleep well though.  Is up through the night.  Sleeps 1-2 hrs, then up.  Is talking in her room at night.    Pt says she feels fine.    Review of Systems:  Review of Systems  Musculoskeletal: Negative for falls.  Neurological: Negative for dizziness.  Psychiatric/Behavioral: Positive for depression and memory loss. The patient has insomnia.     Past Medical History  Diagnosis Date  . Hyperlipidemia LDL goal < 100   . Gout, unspecified   . Tension headache   . Unspecified essential hypertension   . Coronary atherosclerosis of native coronary artery   . Pain in joint, site unspecified   . Osteopenia   . Other malaise and fatigue   . Chest pain, unspecified   . Cataract 2010  . Vascular dementia     Past Surgical History  Procedure Laterality Date  . Spine surgery  [phone]  . Tubal ligation  1960  . Cardiac catheterization      Social History:   reports that she has quit smoking. She has never used smokeless tobacco. She reports that she does not drink alcohol or use illicit drugs.  Family History  Problem Relation Age  of Onset  . Stroke Mother   . Other Neg Hx     Medications: Patient's Medications  New Prescriptions   MIRTAZAPINE (REMERON) 7.5 MG TABLET    Take 1 tablet (7.5 mg total) by mouth at bedtime.  Previous Medications   AMLODIPINE (NORVASC) 5 MG TABLET    Take 5 mg by mouth daily.     ASPIRIN EC 81 MG TABLET    Take 81 mg by mouth at bedtime.     CARBAMAZEPINE (CARBATROL) 100 MG 12 HR CAPSULE    Take 100 mg by mouth 2 (two) times daily.   CHOLECALCIFEROL (VITAMIN D) 1000 UNITS TABLET    Take 2,000 Units by mouth daily.    CLOPIDOGREL (PLAVIX) 75 MG TABLET    Take 1 tablet (75 mg total) by mouth daily.   FISH OIL-OMEGA-3 FATTY ACIDS 1000 MG CAPSULE    Take 1,000 mg by mouth 2 (two) times daily.    GABAPENTIN (NEURONTIN) 300 MG CAPSULE    Take 300 mg by mouth 3 (three) times daily.    HYDROCODONE-ACETAMINOPHEN (NORCO/VICODIN) 5-325 MG PER TABLET    Take 2 tablets by mouth every 4 (four) hours as needed.   LACTOBACILLUS RHAMNOSUS, GG, (CULTURELLE) CAPS    Take 1 capsule by mouth daily.  LORATADINE (CLARITIN) 10 MG TABLET    Take 10 mg by mouth daily as needed for allergies.    LOSARTAN (COZAAR) 50 MG TABLET    Take 50 mg by mouth daily.    METOPROLOL SUCCINATE (TOPROL-XL) 25 MG 24 HR TABLET    TAKE ONE TABLET BY MOUTH EVERY DAY FOR BLOOD PRESSURE WHEN SBP IS GREATER THAN 150.   MISC. DEVICES (ROLLATOR) MISC    Patient needs for unsteady gait, h/o fall, dementia   NAMZARIC 28-10 MG CP24    TAKE ONE CAPSULE BY MOUTH EVERY DAY FOR dementia. MAY open AND put IN food OR drink.   NITROGLYCERIN (NITROSTAT) 0.4 MG SL TABLET    Place 0.4 mg under the tongue every 5 (five) minutes as needed for chest pain. Place one under tongue at onset of chest pain. Repeat every 5 minutes up to twice more if needed.   PANTOPRAZOLE (PROTONIX) 40 MG TABLET    Take 40 mg by mouth daily.    QC STOOL SOFTENER PLS LAXATIVE 8.6-50 MG PER TABLET    Take 1 tablet by mouth 2 (two) times daily as needed.   RANEXA 500 MG 12 HR  TABLET    TAKE ONE TABLET BY MOUTH TWICE DAILY   RANITIDINE (ZANTAC) 300 MG TABLET    TAKE ONE TABLET BY MOUTH IN THE EVENING   SIMVASTATIN (ZOCOR) 20 MG TABLET    TAKE ONE TABLET BY MOUTH EVERY DAY   TRAMADOL HCL 50 MG TBDP    Take 50 mg by mouth 3 (three) times daily. Take 1 tablet by mouth three times daily as needed for severe pain.   TRIAMTERENE-HYDROCHLOROTHIAZIDE (DYAZIDE) 37.5-25 MG PER CAPSULE    Take 1 each (1 capsule total) by mouth 3 (three) times a week. Take 1 tablet once daily for blood pressure.  Modified Medications   No medications on file  Discontinued Medications   CARBAMAZEPINE (TEGRETOL) 100 MG CHEWABLE TABLET       HYDROCODONE-ACETAMINOPHEN (NORCO/VICODIN) 5-325 MG PER TABLET    Take 1 tablet by mouth every 6 (six) hours as needed for moderate pain.    Physical Exam: Filed Vitals:   09/01/14 0904  BP: 158/100  Pulse: 66  Temp: 98.2 F (36.8 C)  TempSrc: Oral  Resp: 18  Height: 5' (1.524 m)  Weight: 151 lb 3.2 oz (68.584 kg)  SpO2: 95%   Physical Exam  Constitutional: She appears well-developed and well-nourished. No distress.  Neurological: She is alert.  Psychiatric:  Pleasant here, nad    Labs reviewed: Basic Metabolic Panel:  Recent Labs  40/98/11 1635 10/18/13 0610 10/19/13 0427 04/25/14 1648  NA 138 143 144 143  K 3.7 3.9 3.9 4.2  CL 98 105 106 100  CO2 24 25 23 24   GLUCOSE 111* 78 79 88  BUN 23 21 19 15   CREATININE 1.83* 1.44* 1.35* 1.23*  CALCIUM 9.6 9.3 9.1 9.8  MG 2.0  --   --   --   TSH  --  0.734  --   --    Liver Function Tests:  Recent Labs  10/17/13 1635 10/18/13 0610  AST 13 10  ALT 7 6  ALKPHOS 91 82  BILITOT 0.2* 0.3  PROT 7.7 7.0  ALBUMIN 4.0 3.6   No results for input(s): LIPASE, AMYLASE in the last 8760 hours. No results for input(s): AMMONIA in the last 8760 hours. CBC:  Recent Labs  10/18/13 0610 10/19/13 0427 04/25/14 1648  WBC 4.2 4.1 4.1  NEUTROABS 1.9 1.2* 1.7  HGB 11.6* 11.2* 11.9  HCT 34.9*  33.5* 36.7  MCV 87.9 87.5 89  PLT 187 179 246   Lipid Panel: No results for input(s): CHOL, HDL, LDLCALC, TRIG, CHOLHDL, LDLDIRECT in the last 8760 hours. No results found for: HGBA1C  Assessment/Plan 1. Depression due to dementia - seems pt has difficulty adjusting to her other daughter being in the home (she has had brain surgeries and has balance problems) -pt being combative and not sleeping well - try mirtazapine (REMERON) 7.5 MG tablet; Take 1 tablet (7.5 mg total) by mouth at bedtime.  Dispense: 14 tablet; Refill: 0  2. Vascular dementia with behavioral disturbance -worsening behaviors recently  Labs/tests ordered:  None today--needs her annual exam Next appt:  asap for annual exam and labs.  appt was still not made when she left  Murry Diaz L. Keyry Iracheta, D.O. Geriatrics [facility] [address], [place] [address] Cell Phone (Mon-Fri 8am-5pm):  [phone] On Call:  [phone] & follow prompts after 5pm & weekends Office Phone:  [phone] Office Fax:  [phone]

## 2014-09-27 ENCOUNTER — Other Ambulatory Visit: Payer: Self-pay | Admitting: Internal Medicine

## 2014-10-10 ENCOUNTER — Ambulatory Visit (INDEPENDENT_AMBULATORY_CARE_PROVIDER_SITE_OTHER): Payer: Medicare Other | Admitting: Podiatry

## 2014-10-10 ENCOUNTER — Encounter: Payer: Self-pay | Admitting: Podiatry

## 2014-10-10 DIAGNOSIS — L84 Corns and callosities: Secondary | ICD-10-CM

## 2014-10-10 DIAGNOSIS — B351 Tinea unguium: Secondary | ICD-10-CM

## 2014-10-10 DIAGNOSIS — M79676 Pain in unspecified toe(s): Secondary | ICD-10-CM

## 2014-10-10 NOTE — Progress Notes (Signed)
Patient ID: Emily Schultz, female   DOB: 26-Jan-1922, 81 y.o.   MRN: [id number]  Subjective: Patient is complaining of painful toenails and a fourth right webspace keratoses  Objective: The toenails are elongated, hypertrophic, incurvated and tender to palpation 6-10 Keratoses lateral fifth right toe and web space without erythema, edema or drainage  Assessment: Symptomatic onychomycoses 6-10 Keratoses 1  Plan: Debridement toenails 10 and keratoses 1 without a bleeding  Dispensed gelcap to apply to fifth toe  Reappoint 3 months

## 2014-11-16 ENCOUNTER — Other Ambulatory Visit: Payer: Self-pay | Admitting: Internal Medicine

## 2014-12-20 DIAGNOSIS — I10 Essential (primary) hypertension: Secondary | ICD-10-CM | POA: Diagnosis not present

## 2014-12-20 DIAGNOSIS — I2510 Atherosclerotic heart disease of native coronary artery without angina pectoris: Secondary | ICD-10-CM | POA: Diagnosis not present

## 2014-12-20 DIAGNOSIS — J309 Allergic rhinitis, unspecified: Secondary | ICD-10-CM | POA: Diagnosis not present

## 2014-12-20 DIAGNOSIS — Z1389 Encounter for screening for other disorder: Secondary | ICD-10-CM | POA: Diagnosis not present

## 2014-12-20 DIAGNOSIS — N183 Chronic kidney disease, stage 3 (moderate): Secondary | ICD-10-CM | POA: Diagnosis not present

## 2014-12-20 DIAGNOSIS — Z0001 Encounter for general adult medical examination with abnormal findings: Secondary | ICD-10-CM | POA: Diagnosis not present

## 2014-12-20 DIAGNOSIS — K219 Gastro-esophageal reflux disease without esophagitis: Secondary | ICD-10-CM | POA: Diagnosis not present

## 2014-12-20 DIAGNOSIS — G894 Chronic pain syndrome: Secondary | ICD-10-CM | POA: Diagnosis not present

## 2014-12-20 DIAGNOSIS — B0229 Other postherpetic nervous system involvement: Secondary | ICD-10-CM | POA: Diagnosis not present

## 2015-01-09 ENCOUNTER — Encounter: Payer: Self-pay | Admitting: Podiatry

## 2015-01-09 ENCOUNTER — Ambulatory Visit (INDEPENDENT_AMBULATORY_CARE_PROVIDER_SITE_OTHER): Payer: Medicare Other | Admitting: Podiatry

## 2015-01-09 VITALS — BP 150/86 | HR 68 | Resp 12

## 2015-01-09 DIAGNOSIS — B351 Tinea unguium: Secondary | ICD-10-CM | POA: Diagnosis not present

## 2015-01-09 DIAGNOSIS — M79676 Pain in unspecified toe(s): Secondary | ICD-10-CM | POA: Diagnosis not present

## 2015-01-10 NOTE — Progress Notes (Signed)
Patient ID: Emily Schultz, female   DOB: 1921-10-08, 81 y.o.   MRN: [id number]  Subjective: This patient presents today complaining of painful toenails and painful keratoses fourth right webspace and requesting t skin a nail debridement  Objective: The toenails are hypertrophic, elongated, discolored and tender to palpation 6-10 Keratoses lateral fifth right toe and fourth right webspace  Assessment: Symptomatic onychomycoses 6-10 Keratoses 1  Plan: Debrided toenails 10 and keratoses 1 without any bleeding Patient loss last gelcap dispensed replacement gelcap to wear in the fifth right toe  Reappoint 3 months

## 2015-01-11 ENCOUNTER — Other Ambulatory Visit: Payer: Self-pay | Admitting: Internal Medicine

## 2015-01-13 ENCOUNTER — Ambulatory Visit (INDEPENDENT_AMBULATORY_CARE_PROVIDER_SITE_OTHER): Payer: Medicare Other | Admitting: Internal Medicine

## 2015-01-13 ENCOUNTER — Encounter: Payer: Self-pay | Admitting: Internal Medicine

## 2015-01-13 VITALS — BP 134/90 | HR 77 | Temp 98.5°F | Wt 163.0 lb

## 2015-01-13 DIAGNOSIS — R569 Unspecified convulsions: Secondary | ICD-10-CM | POA: Diagnosis not present

## 2015-01-13 DIAGNOSIS — M4000 Postural kyphosis, site unspecified: Secondary | ICD-10-CM | POA: Diagnosis not present

## 2015-01-13 DIAGNOSIS — K602 Anal fissure, unspecified: Secondary | ICD-10-CM | POA: Diagnosis not present

## 2015-01-13 MED ORDER — FLUCONAZOLE 150 MG PO TABS: 150.0000 mg | ORAL_TABLET | Freq: Every day | ORAL | Status: DC

## 2015-01-13 NOTE — Progress Notes (Signed)
Patient ID: Emily Schultz, female   DOB: July 31, 1922, 81 y.o.   MRN: [id number]   Location:  [facility] / [facility]  Code Status: full code Goals of Care:  Advanced Directive information Does patient have an advance directive?: Yes, Type of Advance Directive: Healthcare Power of Attorney, Does patient want to make changes to advanced directive?: No - Patient declined   No Known Allergies  Chief Complaint  Patient presents with  . Acute Visit    Exam bottom, patient with tear/rip x 1 month. This is a re-occuring concern. Patients daughter is treating tear with monistat, zinc, and neosporin (somewhat helpful). Tear onset from constipation   . Back Pain    New concern of lower back pain, causing curvature in posture.     HPI: Patient is a 81 y.o. black female seen in the office today for an acute visit due to a skin tear in her anus and low back pain with worsening curvature in spine.  Current treatment is helping as above.    Back pain is typically controlled due to use of hydrocodone and corset use.    Has been having partial seizure activity first noted in February and was seen by Dr. Kevan Ny for this and started on tegretol.  She has had a couple of seizures since if meds not received exactly on time.    Review of Systems:  Review of Systems  Musculoskeletal: Positive for back pain.       Increased kyphosis  Neurological: Positive for seizures. Negative for dizziness and loss of consciousness.  Psychiatric/Behavioral: Positive for memory loss.     Past Medical History  Diagnosis Date  . Hyperlipidemia LDL goal < 100   . Gout, unspecified   . Tension headache   . Unspecified essential hypertension   . Coronary atherosclerosis of native coronary artery   . Pain in joint, site unspecified   . Osteopenia   . Other malaise and fatigue   . Chest pain, unspecified   . Cataract 2010  . Vascular dementia     Past Surgical History  Procedure  Laterality Date  . Spine surgery  [phone]  . Tubal ligation  1960  . Cardiac catheterization      Social History:   reports that she has quit smoking. She has never used smokeless tobacco. She reports that she does not drink alcohol or use illicit drugs.  Family History  Problem Relation Age of Onset  . Stroke Mother   . Other Neg Hx     Medications: Patient's Medications  New Prescriptions   No medications on file  Previous Medications   AMLODIPINE (NORVASC) 5 MG TABLET    Take 5 mg by mouth daily.     ASPIRIN EC 81 MG TABLET    Take 81 mg by mouth at bedtime.     CARBAMAZEPINE (CARBATROL) 100 MG 12 HR CAPSULE    Take 100 mg by mouth 3 (three) times daily.   CHOLECALCIFEROL (VITAMIN D) 1000 UNITS TABLET    Take 2,000 Units by mouth daily.    CLOPIDOGREL (PLAVIX) 75 MG TABLET    Take 1 tablet (75 mg total) by mouth daily.   FISH OIL-OMEGA-3 FATTY ACIDS 1000 MG CAPSULE    Take 1,000 mg by mouth 2 (two) times daily.    GABAPENTIN (NEURONTIN) 300 MG CAPSULE    Take 300 mg by mouth 3 (three) times daily.    HYDROCODONE-ACETAMINOPHEN (NORCO/VICODIN) 5-325 MG PER TABLET  Take 2 tablets by mouth every 4 (four) hours as needed.   LACTOBACILLUS RHAMNOSUS, GG, (CULTURELLE) CAPS    Take 1 capsule by mouth daily.    LORATADINE (CLARITIN) 10 MG TABLET    Take 10 mg by mouth daily as needed for allergies.    LOSARTAN (COZAAR) 50 MG TABLET    Take 50 mg by mouth daily.    METOPROLOL SUCCINATE (TOPROL-XL) 25 MG 24 HR TABLET    TAKE ONE TABLET BY MOUTH EVERY DAY FOR BLOOD PRESSURE WHEN SBP IS GREATER THAN 150.   MIRTAZAPINE (REMERON) 15 MG TABLET    TAKE 1/2 TABLET BY MOUTH AT BEDTIME   MISC. DEVICES (ROLLATOR) MISC    Patient needs for unsteady gait, h/o fall, dementia   NAMZARIC 28-10 MG CP24    TAKE ONE CAPSULE BY MOUTH EVERY DAY FOR dementia. MAY open AND put IN food OR drink.   NITROGLYCERIN (NITROSTAT) 0.4 MG SL TABLET    Place 0.4 mg under the tongue every 5 (five) minutes as needed for  chest pain. Place one under tongue at onset of chest pain. Repeat every 5 minutes up to twice more if needed.   PANTOPRAZOLE (PROTONIX) 40 MG TABLET    Take 40 mg by mouth daily.    QC STOOL SOFTENER PLS LAXATIVE 8.6-50 MG PER TABLET    Take 1 tablet by mouth 2 (two) times daily as needed.   RANEXA 500 MG 12 HR TABLET    TAKE ONE TABLET BY MOUTH TWICE DAILY   SIMVASTATIN (ZOCOR) 20 MG TABLET    TAKE ONE TABLET BY MOUTH EVERY DAY   TRAMADOL HCL 50 MG TBDP    Take 50 mg by mouth 3 (three) times daily. Take 1 tablet by mouth three times daily as needed for severe pain.   TRIAMTERENE-HYDROCHLOROTHIAZIDE (DYAZIDE) 37.5-25 MG PER CAPSULE    Take 1 each (1 capsule total) by mouth 3 (three) times a week. Take 1 tablet once daily for blood pressure.  Modified Medications   No medications on file  Discontinued Medications   FLUCONAZOLE (DIFLUCAN) 150 MG TABLET       RANITIDINE (ZANTAC) 300 MG TABLET    TAKE ONE TABLET BY MOUTH IN THE EVENING     Physical Exam: Filed Vitals:   01/13/15 1117  BP: 134/90  Pulse: 77  Temp: 98.5 F (36.9 C)  TempSrc: Oral  Weight: 163 lb (73.936 kg)  SpO2: 90%  Physical Exam  Constitutional: She appears well-developed and well-nourished.  Pt is in agonizing pain on exam table due to back pain (has not had her pain medications this am) and is actively having partial seizure  Cardiovascular: Normal rate, regular rhythm, normal heart sounds and intact distal pulses.   Pulmonary/Chest: Effort normal and breath sounds normal. No respiratory distress.  Musculoskeletal:  Unsteady short shuffling gait  Neurological: She is alert.  Still speaking, moaning in pain  Skin: Skin is warm and dry.    Labs reviewed: Basic Metabolic Panel:  Recent Labs  16/10/96 1648  NA 143  K 4.2  CL 100  CO2 24  GLUCOSE 88  BUN 15  CREATININE 1.23*  CALCIUM 9.8   Liver Function Tests: No results for input(s): AST, ALT, ALKPHOS, BILITOT, PROT, ALBUMIN in the last 8760  hours. No results for input(s): LIPASE, AMYLASE in the last 8760 hours. No results for input(s): AMMONIA in the last 8760 hours. CBC:  Recent Labs  04/25/14 1648  WBC 4.1  NEUTROABS 1.7  HGB 11.9  HCT 36.7  MCV 89  PLT 246   Lipid Panel: No results for input(s): CHOL, HDL, LDLCALC, TRIG, CHOLHDL, LDLDIRECT in the last 8760 hours. No results found for: HGBA1C  Assessment/Plan 1. Partial seizures -continue tegretol for now as ordered -needs dose asap so seen quickly and sent home to get her medication (didn't have morning dose) -if seizures become recurrent, she should be seen and consider changing to new alternative agent with less side effects -got mixed story about how often these were happening since tegretol started--her daughter says pt is dramatic  2. Anal fissure - cont monistat, zinc, abx ointment which seems to be helping  3. Kyphosis (acquired) (postural) -cont use of corset/girdle -due to osteoporosis -cont pain meds for low back pain, but she must receive them appropriately--we gave her two tylenol here to help with pain  Labs/tests ordered: none Next appt:  3 mos--due for prevnar next time  Howell Groesbeck L. Malani Lees, D.O. Geriatrics [facility] [address], [place] [address] Cell Phone (Mon-Fri 8am-5pm):  [phone] On Call:  [phone] & follow prompts after 5pm & weekends Office Phone:  [phone] Office Fax:  [phone]

## 2015-01-13 NOTE — Patient Instructions (Signed)
If seizures continue to recur, please come in for med changes.

## 2015-01-29 IMAGING — CR DG LUMBAR SPINE COMPLETE 4+V
5 series · 5 of 5 positions shown · non-contrast
Comparison: Abdomen and pelvis CT dated 09/17/2012.

CLINICAL DATA: Low back pain following a fall.

EXAM:
LUMBAR SPINE - COMPLETE 4+ VIEW

[t l-spine a.p.]
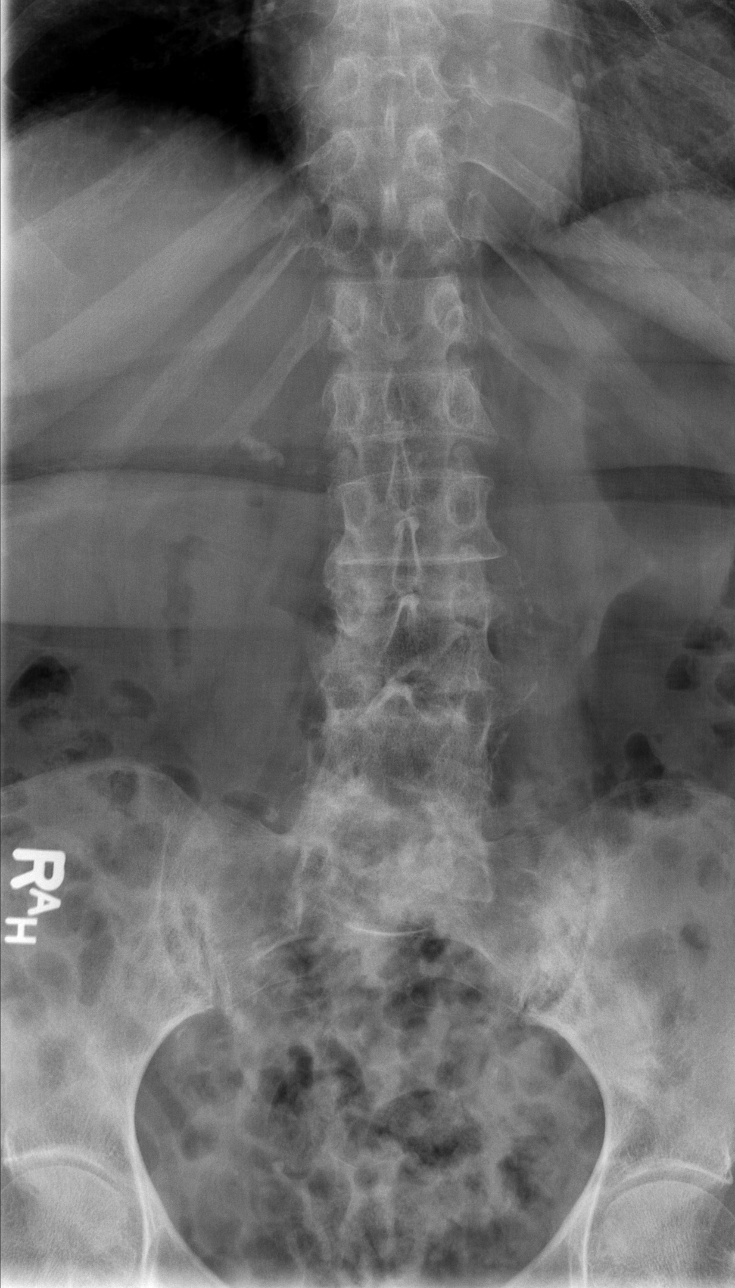

[t l-spine oblique exposure (1 of 2)]
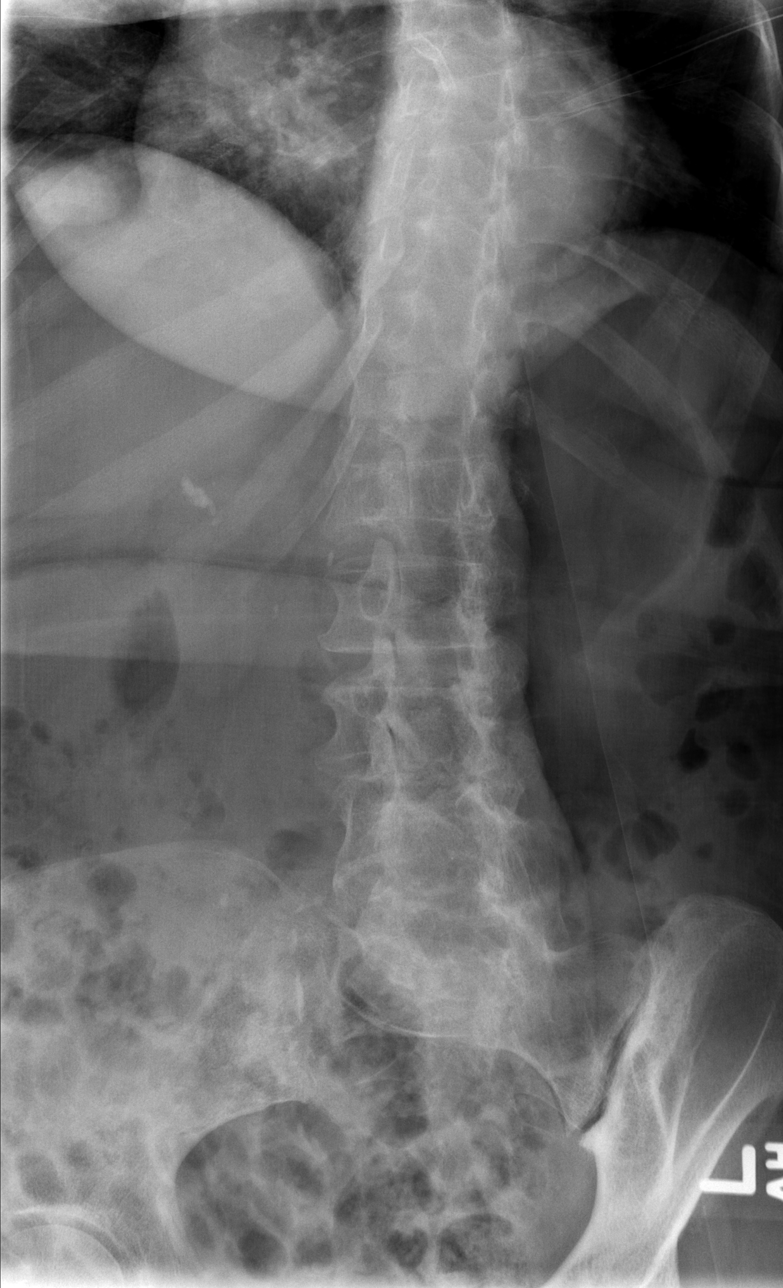

[t l-spine oblique exposure (2 of 2)]
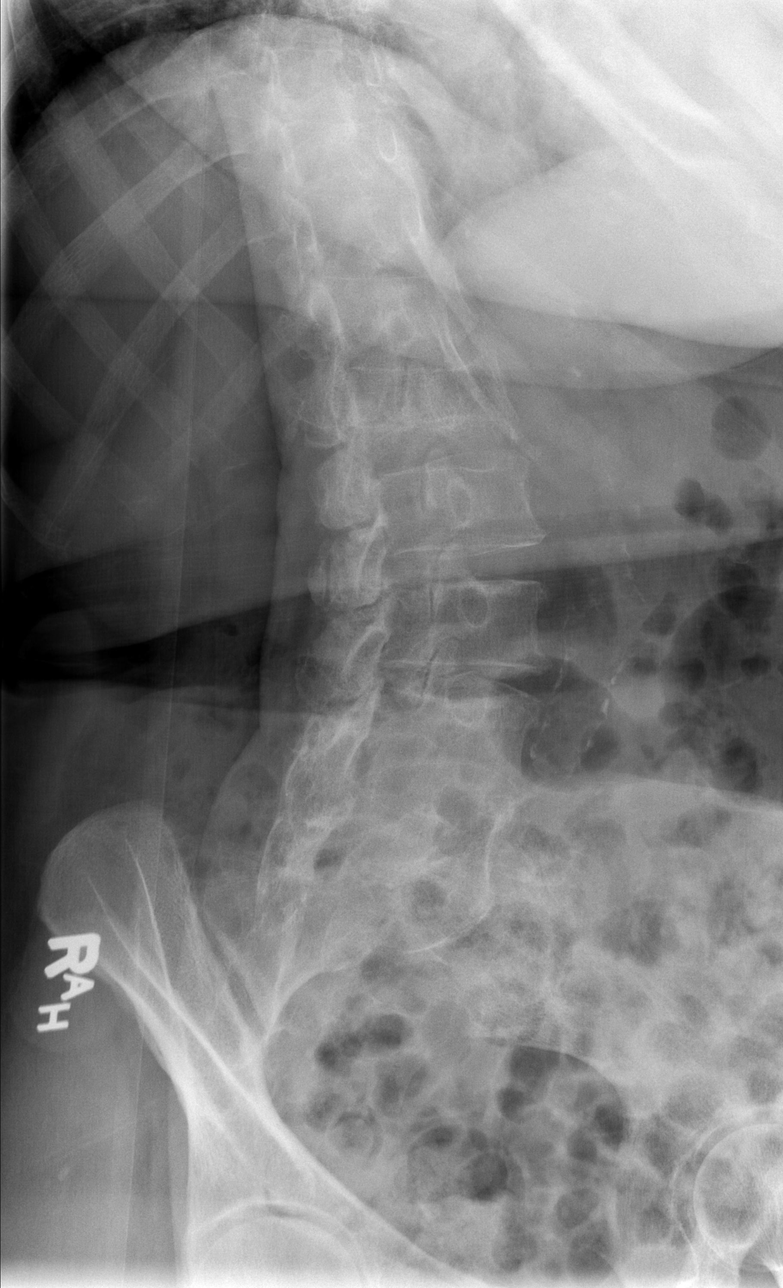

[t l-spine lat]
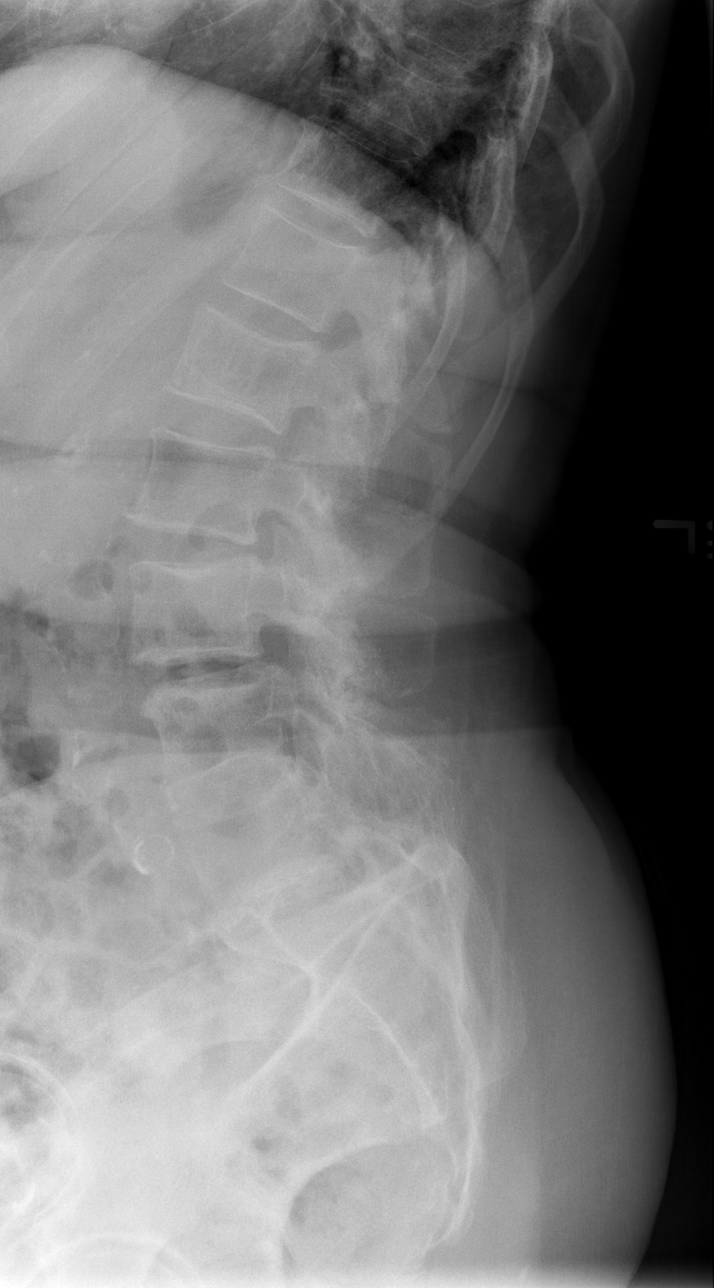

[t l-spine l5-s1 spot]
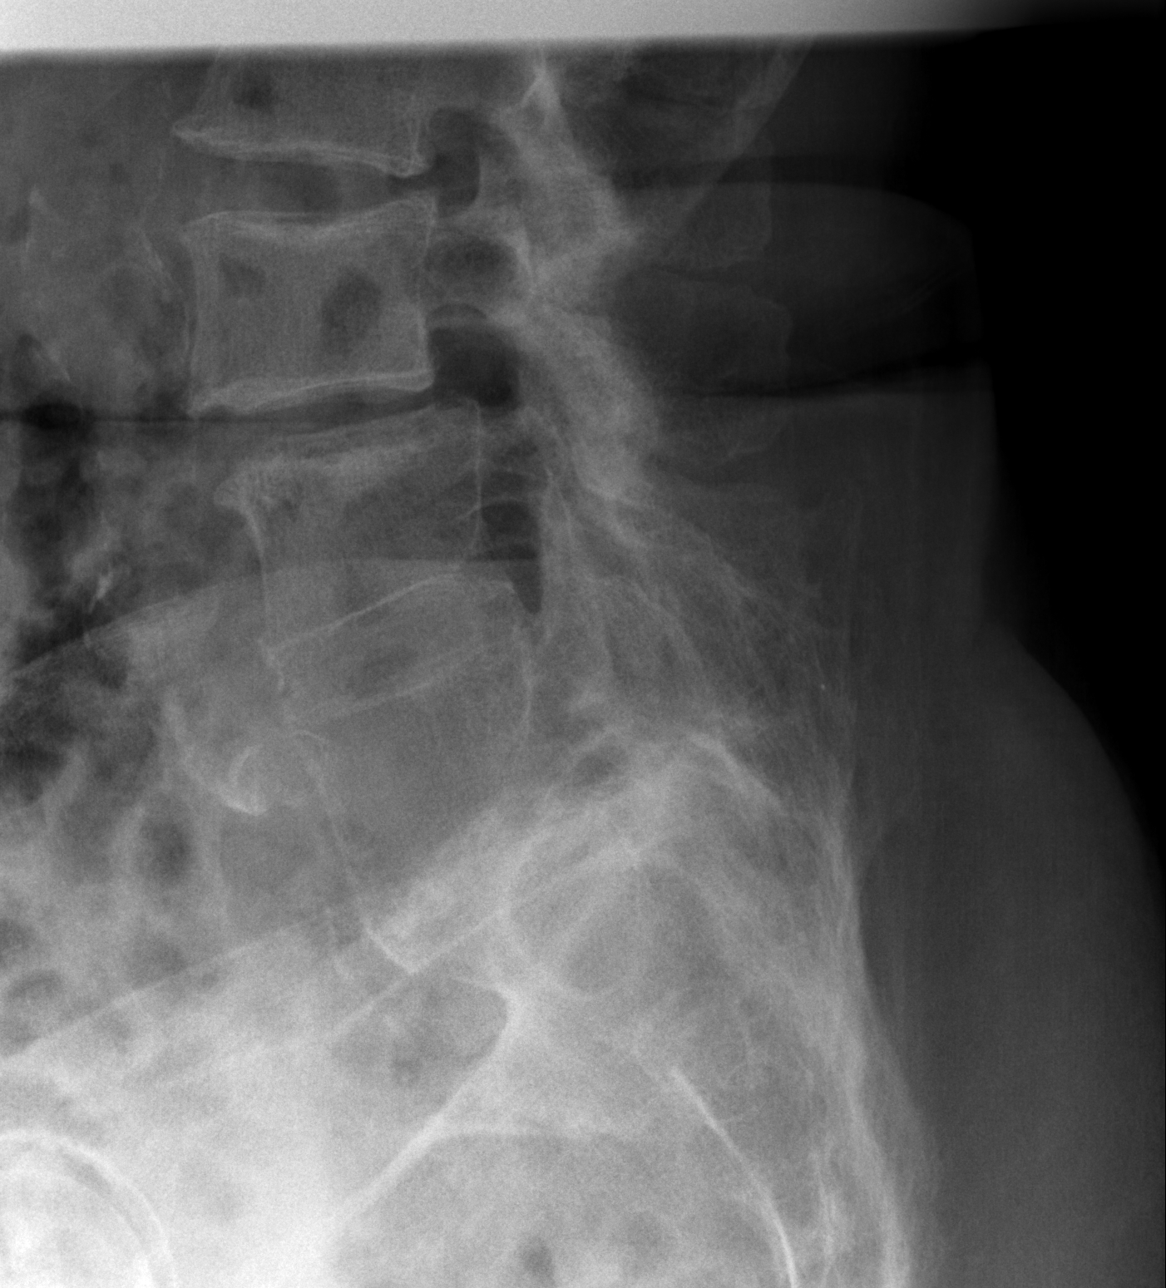

[5 of 5 positions shown; findings below may reference images not displayed]

FINDINGS: Previously demonstrated calcified periportal lymph nodes.
Atheromatous arterial calcifications. Lumbar spine degenerative and
postsurgical changes including bone fusion in the lower lumbar spine
and lumbosacral junction. Stable grade 1 anterolisthesis at the L3-4
level. No fractures or acute subluxations. Previously noted L1
vertebral hemangioma with a stable mild compression deformity at
that level.
IMPRESSION: 1. No acute abnormality.
2. Stable degenerative and postoperative changes.

## 2015-03-06 ENCOUNTER — Other Ambulatory Visit: Payer: Self-pay | Admitting: Internal Medicine

## 2015-04-10 ENCOUNTER — Ambulatory Visit: Payer: Medicare Other | Admitting: Podiatry

## 2015-04-20 ENCOUNTER — Encounter: Payer: Self-pay | Admitting: Internal Medicine

## 2015-04-20 ENCOUNTER — Ambulatory Visit: Payer: Medicare Other | Admitting: Internal Medicine

## 2015-04-26 ENCOUNTER — Ambulatory Visit (INDEPENDENT_AMBULATORY_CARE_PROVIDER_SITE_OTHER): Payer: Medicare Other | Admitting: Podiatry

## 2015-04-26 ENCOUNTER — Encounter: Payer: Self-pay | Admitting: Podiatry

## 2015-04-26 DIAGNOSIS — B351 Tinea unguium: Secondary | ICD-10-CM | POA: Diagnosis not present

## 2015-04-26 DIAGNOSIS — L84 Corns and callosities: Secondary | ICD-10-CM | POA: Diagnosis not present

## 2015-04-26 DIAGNOSIS — M79676 Pain in unspecified toe(s): Secondary | ICD-10-CM | POA: Diagnosis not present

## 2015-04-27 NOTE — Progress Notes (Signed)
Patient ID: Emily Schultz, female   DOB: Dec 10, 1921, 81 y.o.   MRN: [id number]  Subjective: This patient presents for a scheduled visit complaining of painful toenails and a right and left feet when walking wearing shoes. She is also complaining of a painful corn or fourth right webspace  Objective: The toenails are elongated, hypertrophic, discolored and tender direct palpation 6-10 Keratoses fourth right webspace without any surrounding erythema, edema or drainage  Assessment: Symptomatic onychomycoses 6-10 Painful keratoses fourth right webspace  Plan: Debridement toenails 10 mechanically and electrically without any bleeding Debrided plantar keratoses 1 without any bleeding  Reappoint 3 months

## 2015-05-23 ENCOUNTER — Telehealth: Payer: Self-pay | Admitting: *Deleted

## 2015-05-23 MED ORDER — FLUCONAZOLE 150 MG PO TABS: 150.0000 mg | ORAL_TABLET | Freq: Every day | ORAL | Status: AC

## 2015-05-23 NOTE — Telephone Encounter (Signed)
Patient daughter notified and stated that she will call and make an appointment. Rx faxed to pharmacy.

## 2015-05-23 NOTE — Telephone Encounter (Signed)
Patient daughter called and wanted a refill on patient's Diflucan. She stated that you refill it for her for when she get yeast flare ups. Please Advise.

## 2015-05-23 NOTE — Telephone Encounter (Signed)
Ok to refill, but pt needs appt sometime soon.  Was supposed to come last month.

## 2015-05-25 ENCOUNTER — Other Ambulatory Visit: Payer: Self-pay | Admitting: Internal Medicine

## 2015-07-26 ENCOUNTER — Ambulatory Visit (INDEPENDENT_AMBULATORY_CARE_PROVIDER_SITE_OTHER): Payer: Medicare Other | Admitting: Podiatry

## 2015-07-26 ENCOUNTER — Encounter: Payer: Self-pay | Admitting: Podiatry

## 2015-07-26 DIAGNOSIS — L84 Corns and callosities: Secondary | ICD-10-CM | POA: Diagnosis not present

## 2015-07-26 DIAGNOSIS — M79676 Pain in unspecified toe(s): Secondary | ICD-10-CM | POA: Diagnosis not present

## 2015-07-26 DIAGNOSIS — B351 Tinea unguium: Secondary | ICD-10-CM | POA: Diagnosis not present

## 2015-07-26 NOTE — Progress Notes (Signed)
Patient ID: Emily Schultz, female   DOB: 12-13-1921, 81 y.o.   MRN: [id number]  Subjective: This patient presents again for schedule visit complaining of painful toenails on the right and left feet walking wearing shoes. She is also complaining of a painful corn on the fourth right webspace  Objective: The toenails are discolored, hypertrophic, elongated and tender to direct palpation 6-10 Keratoses fourth right webspace without any surrounding erythema, edema or drainage  Assessment: Symptomatic onychomycoses 6-10 Keratoses fourth right webspace  Plan: Debridement toenails 6-10 mechanically and electrically without any bleeding Debrided keratoses fourth right webspace without any bleeding  Reappoint 3 months

## 2015-08-21 ENCOUNTER — Other Ambulatory Visit: Payer: Self-pay | Admitting: Internal Medicine

## 2015-09-14 ENCOUNTER — Other Ambulatory Visit: Payer: Self-pay | Admitting: Internal Medicine

## 2015-09-14 ENCOUNTER — Telehealth: Payer: Self-pay | Admitting: *Deleted

## 2015-09-14 NOTE — Telephone Encounter (Signed)
Left message on machine for patients daughter to return call when available to make appointment for her mother.

## 2015-09-14 NOTE — Telephone Encounter (Signed)
Called patient's daughter to schedule an appointment, left message on the machine.

## 2015-09-18 ENCOUNTER — Other Ambulatory Visit: Payer: Self-pay | Admitting: *Deleted

## 2015-09-18 MED ORDER — MEMANTINE HCL-DONEPEZIL HCL ER 28-10 MG PO CP24: 1.0000 | ORAL_CAPSULE | Freq: Every day | ORAL | Status: DC

## 2015-09-18 NOTE — Telephone Encounter (Signed)
Spoke with daughter and appointment made with Dr. Renato Gails for February. Daughter did not want to bring in January due to weather. Appointment made and 30 days worth of Namzaric faxed to pharmacy.

## 2015-10-12 ENCOUNTER — Ambulatory Visit (INDEPENDENT_AMBULATORY_CARE_PROVIDER_SITE_OTHER): Payer: Medicare Other | Admitting: Internal Medicine

## 2015-10-12 VITALS — HR 62 | Temp 98.0°F | Resp 20 | Wt 156.4 lb

## 2015-10-12 DIAGNOSIS — R569 Unspecified convulsions: Secondary | ICD-10-CM | POA: Diagnosis not present

## 2015-10-12 DIAGNOSIS — F0150 Vascular dementia without behavioral disturbance: Secondary | ICD-10-CM | POA: Diagnosis not present

## 2015-10-12 DIAGNOSIS — G8929 Other chronic pain: Secondary | ICD-10-CM | POA: Diagnosis not present

## 2015-10-12 DIAGNOSIS — M8580 Other specified disorders of bone density and structure, unspecified site: Secondary | ICD-10-CM | POA: Diagnosis not present

## 2015-10-12 DIAGNOSIS — M545 Low back pain, unspecified: Secondary | ICD-10-CM

## 2015-10-12 DIAGNOSIS — W19XXXD Unspecified fall, subsequent encounter: Secondary | ICD-10-CM | POA: Diagnosis not present

## 2015-10-12 DIAGNOSIS — I25119 Atherosclerotic heart disease of native coronary artery with unspecified angina pectoris: Secondary | ICD-10-CM | POA: Diagnosis not present

## 2015-10-12 DIAGNOSIS — Y92009 Unspecified place in unspecified non-institutional (private) residence as the place of occurrence of the external cause: Secondary | ICD-10-CM

## 2015-10-12 MED ORDER — HYDROCODONE-ACETAMINOPHEN 5-325 MG PO TABS
2.0000 | ORAL_TABLET | ORAL | Status: DC | PRN
Start: 2015-10-12 — End: 2015-11-20

## 2015-10-12 MED ORDER — ROLLATOR MISC: Status: AC

## 2015-10-12 NOTE — Progress Notes (Signed)
Patient ID: Emily Schultz, female   DOB: 1922-02-16, 82 y.o.   MRN: [id number]   Location: [facility] Provider: Gwenith Spitz. Renato Gails, D.O., C.M.D.  Code Status: DNR Goals of Care: Advanced Directive information Does patient have an advance directive?: Yes, Type of Advance Directive: Living will;Out of facility DNR (pink MOST or yellow form), Does patient want to make changes to advanced directive?: No - Patient declined  Chief Complaint  Patient presents with  . Medical Management of Chronic Issues    f/u -partial seizures, having lower back pain    HPI: Patient is a 82 y.o. female seen in the office today for med mgt of chronic diseases.    Back pain.  Bothers her most at night.  Sometimes the hydrocodone helps and sometimes does not.  biofreeze also helps her.  Asks for Rx for rollator walker--given.  No falls since last appt.  BP high w/o bp meds.  ?CMA did not document  Her daughter is going through cancer treatments at [facility]  Her youngest sister has also had cancer and stays with them.    Partial seizures:  Under control with medications.  On tegretol.  I don't see her following with neurology but level cannot be drawn today b/c we do not have a phlebotomist here.  CMAs to draw other blood.   Her favorite restaurant is [employer].  Appetite is fabulous.    Review of Systems:  Review of Systems  Constitutional: Negative for fever, chills and weight loss.  HENT: Positive for hearing loss. Negative for congestion.   Eyes: Negative for blurred vision.  Respiratory: Negative for shortness of breath.   Cardiovascular: Negative for chest pain.  Gastrointestinal: Negative for abdominal pain, constipation, blood in stool and melena.  Genitourinary: Negative for dysuria, urgency and frequency.  Musculoskeletal: Positive for back pain. Negative for falls.       Unsteady gait  Skin: Negative for itching and rash.  Neurological: Negative for dizziness and headaches.    Psychiatric/Behavioral: Positive for memory loss. Negative for depression. The patient is not nervous/anxious and does not have insomnia.     Past Medical History  Diagnosis Date  . Hyperlipidemia LDL goal < 100   . Gout, unspecified   . Tension headache   . Unspecified essential hypertension   . Coronary atherosclerosis of native coronary artery   . Pain in joint, site unspecified   . Osteopenia   . Other malaise and fatigue   . Chest pain, unspecified   . Cataract 2010  . Vascular dementia     Past Surgical History  Procedure Laterality Date  . Spine surgery  [phone]  . Tubal ligation  1960  . Cardiac catheterization      No Known Allergies    Medication List       This list is accurate as of: 10/12/15 12:08 PM.  Always use your most recent med list.               amLODipine 5 MG tablet  Commonly known as:  NORVASC  Take 5 mg by mouth daily.     aspirin EC 81 MG tablet  Take 81 mg by mouth at bedtime.     carbamazepine 100 MG 12 hr capsule  Commonly known as:  CARBATROL  Take 100 mg by mouth 3 (three) times daily.     cholecalciferol 1000 units tablet  Commonly known as:  VITAMIN D  Take 2,000 Units by mouth daily.  clopidogrel 75 MG tablet  Commonly known as:  PLAVIX  Take 1 tablet (75 mg total) by mouth daily.     CULTURELLE Caps  Take 1 capsule by mouth daily.     fish oil-omega-3 fatty acids 1000 MG capsule  Take 1,000 mg by mouth 2 (two) times daily.     fluconazole 150 MG tablet  Commonly known as:  DIFLUCAN  Take 1 tablet (150 mg total) by mouth daily.     gabapentin 300 MG capsule  Commonly known as:  NEURONTIN  Take 300 mg by mouth 3 (three) times daily.     HYDROcodone-acetaminophen 5-325 MG tablet  Commonly known as:  NORCO/VICODIN  Take 2 tablets by mouth every 4 (four) hours as needed.     loratadine 10 MG tablet  Commonly known as:  CLARITIN  Take 10 mg by mouth daily as needed for allergies.     losartan 50 MG tablet   Commonly known as:  COZAAR  Take 50 mg by mouth daily.     Memantine HCl-Donepezil HCl 28-10 MG Cp24  Commonly known as:  NAMZARIC  Take 1 capsule by mouth daily. To preserve memory     metoprolol succinate 25 MG 24 hr tablet  Commonly known as:  TOPROL-XL  TAKE ONE TABLET BY MOUTH EVERY DAY FOR BLOOD PRESSURE WHEN SBP IS GREATER THAN 150.     mirtazapine 15 MG tablet  Commonly known as:  REMERON  TAKE 1/2 TABLET BY MOUTH AT BEDTIME     nitroGLYCERIN 0.4 MG SL tablet  Commonly known as:  NITROSTAT  Place 0.4 mg under the tongue every 5 (five) minutes as needed for chest pain. Place one under tongue at onset of chest pain. Repeat every 5 minutes up to twice more if needed.     pantoprazole 40 MG tablet  Commonly known as:  PROTONIX  Take 40 mg by mouth daily.     QC STOOL SOFTENER PLS LAXATIVE 8.6-50 MG tablet  Generic drug:  senna-docusate  Take 1 tablet by mouth 2 (two) times daily as needed.     RANEXA 500 MG 12 hr tablet  Generic drug:  ranolazine  TAKE ONE TABLET BY MOUTH TWICE DAILY     ROLLATOR Misc  Patient needs for unsteady gait, h/o fall, dementia     simvastatin 20 MG tablet  Commonly known as:  ZOCOR  TAKE ONE TABLET BY MOUTH EVERY DAY     TraMADol HCl 50 MG Tbdp  Take 50 mg by mouth 3 (three) times daily. Take 1 tablet by mouth three times daily as needed for severe pain.     triamterene-hydrochlorothiazide 37.5-25 MG capsule  Commonly known as:  DYAZIDE  Take 1 each (1 capsule total) by mouth 3 (three) times a week. Take 1 tablet once daily for blood pressure.        Health Maintenance  Topic Date Due  . TETANUS/TDAP  10/28/1942  . ZOSTAVAX  10/29/1983  . PNA vac Low Risk Adult (1 of 2 - PCV13) 10/28/1988  . INFLUENZA VACCINE  06/02/2017 (Originally 04/10/2015)  . DEXA SCAN  Completed    Physical Exam: Filed Vitals:   10/12/15 1113  Pulse: 62  Temp: 98 F (36.7 C)  TempSrc: Oral  Resp: 20  Weight: 156 lb 6.4 oz (70.943 kg)  SpO2: 96%    Body mass index is 30.54 kg/(m^2). Physical Exam  Constitutional: She appears well-developed and well-nourished. No distress.  HENT:  HOH  Eyes:  glasses  Cardiovascular: Normal rate, regular rhythm, normal heart sounds and intact distal pulses.   Pulmonary/Chest: Effort normal and breath sounds normal. No respiratory distress.  Abdominal: Soft. Bowel sounds are normal. She exhibits no distension. There is no tenderness.  Musculoskeletal: Normal range of motion.  Has wide based gait, unsteady  Neurological: She is alert.  Oriented to person   Skin: Skin is warm and dry.  Psychiatric: She has a normal mood and affect.    Labs reviewed: Drawn today  Assessment/Plan 1. Vascular dementia, without behavioral disturbance - pretty stable per her daughter -continues on namzaric - Misc. Devices (ROLLATOR) MISC; Patient needs for unsteady gait, h/o fall, dementia  Dispense: 1 each; Refill: 0  2. Fall at home, subsequent encounter - none recently but high risk due to meds, prior falls, unsteady gait - Misc. Devices (ROLLATOR) MISC; Patient needs for unsteady gait, h/o fall, dementia  Dispense: 1 each; Refill: 0 - CBC with Differential/Platelet - Comprehensive metabolic panel  3. Partial seizures (HCC) -cont tegretol, will need level if comes on a mon/wed/fri  4. Atherosclerosis of native coronary artery of native heart with angina pectoris (HCC) - occasional chest pains that come and go, has ntg to use - CBC with Differential/Platelet - Comprehensive metabolic panel - Lipid panel -cont secondary prevention regimen  5. Osteopenia -vitamin D, walking for exercise -needs bone density  6. Chronic low back pain -cont hydrocodone when pain severe, tylenol, use of biofreeze   Labs/tests ordered:   Orders Placed This Encounter  Procedures  . CBC with Differential/Platelet  . Comprehensive metabolic panel    Order Specific Question:  Has the patient fasted?    Answer:  Yes  .  Lipid panel    Order Specific Question:  Has the patient fasted?    Answer:  Yes    Next appt:  Asked to return in 6 mos, but appt not made before leaving  Sherman Donaldson L. Ayauna Mcnay, D.O. Geriatrics [facility] [address], [place] [address] Cell Phone (Mon-Fri 8am-5pm):  [phone] On Call:  [phone] & follow prompts after 5pm & weekends Office Phone:  [phone] Office Fax:  [phone]

## 2015-10-13 LAB — LIPID PANEL
Chol/HDL Ratio: 4.1 ratio units (ref 0.0–4.4)
Cholesterol, Total: 305 mg/dL — ABNORMAL HIGH (ref 100–199)
HDL: 74 mg/dL (ref >39)
LDL Calculated: 202 mg/dL — ABNORMAL HIGH (ref 0–99)
Triglycerides: 145 mg/dL (ref 0–149)
VLDL Cholesterol Cal: 29 mg/dL (ref 5–40)

## 2015-10-13 LAB — CBC WITH DIFFERENTIAL/PLATELET
Basophils Absolute: 0 10*3/uL (ref 0.0–0.2)
Basos: 1 %
EOS (ABSOLUTE): 0.2 10*3/uL (ref 0.0–0.4)
Eos: 5 %
Hematocrit: 38.1 % (ref 34.0–46.6)
Hemoglobin: 12.1 g/dL (ref 11.1–15.9)
Immature Grans (Abs): 0 10*3/uL (ref 0.0–0.1)
Immature Granulocytes: 0 %
Lymphocytes Absolute: 1.6 10*3/uL (ref 0.7–3.1)
Lymphs: 48 %
MCH: 29.4 pg (ref 26.6–33.0)
MCHC: 31.8 g/dL (ref 31.5–35.7)
MCV: 93 fL (ref 79–97)
Monocytes Absolute: 0.2 10*3/uL (ref 0.1–0.9)
Monocytes: 7 %
Neutrophils Absolute: 1.3 10*3/uL — ABNORMAL LOW (ref 1.4–7.0)
Neutrophils: 39 %
Platelets: 187 10*3/uL (ref 150–379)
RBC: 4.11 x10E6/uL (ref 3.77–5.28)
RDW: 15.6 % — ABNORMAL HIGH (ref 12.3–15.4)
WBC: 3.3 10*3/uL — ABNORMAL LOW (ref 3.4–10.8)

## 2015-10-13 LAB — COMPREHENSIVE METABOLIC PANEL
ALT: 6 IU/L (ref 0–32)
AST: 14 IU/L (ref 0–40)
Albumin/Globulin Ratio: 1.7 (ref 1.1–2.5)
Albumin: 4.5 g/dL (ref 3.2–4.6)
Alkaline Phosphatase: 94 IU/L (ref 39–117)
BUN/Creatinine Ratio: 13 (ref 11–26)
BUN: 19 mg/dL (ref 10–36)
Bilirubin Total: 0.3 mg/dL (ref 0.0–1.2)
CO2: 24 mmol/L (ref 18–29)
Calcium: 9.8 mg/dL (ref 8.7–10.3)
Chloride: 103 mmol/L (ref 96–106)
Creatinine, Ser: 1.43 mg/dL — ABNORMAL HIGH (ref 0.57–1.00)
GFR calc Af Amer: 37 mL/min/{1.73_m2} — ABNORMAL LOW (ref >59)
GFR calc non Af Amer: 32 mL/min/{1.73_m2} — ABNORMAL LOW (ref >59)
Globulin, Total: 2.7 g/dL (ref 1.5–4.5)
Glucose: 87 mg/dL (ref 65–99)
Potassium: 4.7 mmol/L (ref 3.5–5.2)
Sodium: 144 mmol/L (ref 134–144)
Total Protein: 7.2 g/dL (ref 6.0–8.5)

## 2015-10-13 MED ORDER — SIMVASTATIN 40 MG PO TABS: 40.0000 mg | ORAL_TABLET | Freq: Every day | ORAL | Status: AC

## 2015-10-15 ENCOUNTER — Encounter: Payer: Self-pay | Admitting: Internal Medicine

## 2015-10-25 ENCOUNTER — Ambulatory Visit: Payer: Medicare Other | Admitting: Podiatry

## 2015-10-27 ENCOUNTER — Other Ambulatory Visit: Payer: Self-pay | Admitting: Internal Medicine

## 2015-11-20 ENCOUNTER — Other Ambulatory Visit: Payer: Self-pay | Admitting: *Deleted

## 2015-11-20 MED ORDER — HYDROCODONE-ACETAMINOPHEN 5-325 MG PO TABS: 2.0000 | ORAL_TABLET | ORAL | Status: DC | PRN

## 2015-11-20 NOTE — Telephone Encounter (Signed)
Patient daughter Requested and will pick up 

## 2015-12-06 ENCOUNTER — Encounter: Payer: Self-pay | Admitting: Podiatry

## 2015-12-06 ENCOUNTER — Ambulatory Visit (INDEPENDENT_AMBULATORY_CARE_PROVIDER_SITE_OTHER): Payer: Medicare Other | Admitting: Podiatry

## 2015-12-06 DIAGNOSIS — M79676 Pain in unspecified toe(s): Secondary | ICD-10-CM | POA: Diagnosis not present

## 2015-12-06 DIAGNOSIS — B351 Tinea unguium: Secondary | ICD-10-CM | POA: Diagnosis not present

## 2015-12-06 DIAGNOSIS — L84 Corns and callosities: Secondary | ICD-10-CM | POA: Diagnosis not present

## 2015-12-06 NOTE — Progress Notes (Signed)
Patient ID: Emily Schultz, female   DOB: 09/11/1921, 82 y.o.   MRN: [id number]  Subjective: This patient presents again for schedule visit complaining of painful toenails on the right and left feet walking wearing shoes. She is also complaining of a painful corn on the fourth right webspace Patient's daughter present treatment room today  Objective: The toenails are discolored, hypertrophic, elongated and tender to direct palpation 6-10 Keratoses fourth right webspace without any surrounding erythema, edema or drainage HAV bilaterally  Assessment: Symptomatic onychomycoses 6-10 Keratoses fourth right webspace  Plan: Debridement toenails 6-10 mechanically and electrically without any bleeding Debrided keratoses fourth right webspace without any bleeding  Reappoint 3 months

## 2016-01-01 DIAGNOSIS — K219 Gastro-esophageal reflux disease without esophagitis: Secondary | ICD-10-CM | POA: Diagnosis not present

## 2016-01-01 DIAGNOSIS — I2510 Atherosclerotic heart disease of native coronary artery without angina pectoris: Secondary | ICD-10-CM | POA: Diagnosis not present

## 2016-01-01 DIAGNOSIS — M1990 Unspecified osteoarthritis, unspecified site: Secondary | ICD-10-CM | POA: Diagnosis not present

## 2016-01-01 DIAGNOSIS — Z0001 Encounter for general adult medical examination with abnormal findings: Secondary | ICD-10-CM | POA: Diagnosis not present

## 2016-01-01 DIAGNOSIS — J309 Allergic rhinitis, unspecified: Secondary | ICD-10-CM | POA: Diagnosis not present

## 2016-01-01 DIAGNOSIS — B0223 Postherpetic polyneuropathy: Secondary | ICD-10-CM | POA: Diagnosis not present

## 2016-01-01 DIAGNOSIS — I10 Essential (primary) hypertension: Secondary | ICD-10-CM | POA: Diagnosis not present

## 2016-01-01 DIAGNOSIS — K5900 Constipation, unspecified: Secondary | ICD-10-CM | POA: Diagnosis not present

## 2016-01-01 DIAGNOSIS — Z1389 Encounter for screening for other disorder: Secondary | ICD-10-CM | POA: Diagnosis not present

## 2016-01-02 ENCOUNTER — Other Ambulatory Visit: Payer: Self-pay | Admitting: Internal Medicine

## 2016-01-02 NOTE — Telephone Encounter (Signed)
This medication was originally prescribed in 2012 and is no longer on patient's medication list. If patient is in need of medications she will need to call and discuss with triage/provider

## 2016-02-22 ENCOUNTER — Other Ambulatory Visit: Payer: Self-pay | Admitting: *Deleted

## 2016-02-22 MED ORDER — HYDROCODONE-ACETAMINOPHEN 5-325 MG PO TABS: 2.0000 | ORAL_TABLET | ORAL | Status: AC | PRN

## 2016-02-22 NOTE — Telephone Encounter (Signed)
Patient caregiver requested. Patient does need an appointment and caregiver aware and will make one when she picks up Rx.

## 2016-02-27 ENCOUNTER — Telehealth: Payer: Self-pay | Admitting: *Deleted

## 2016-02-27 ENCOUNTER — Other Ambulatory Visit: Payer: Self-pay | Admitting: Internal Medicine

## 2016-02-27 NOTE — Telephone Encounter (Signed)
Latoya with [facility] called and stated that Dr. Marden Noble is filling patient's Hydrocodone Rx for One tablet every 4-6 hours for pain. Informed her that we was not aware that he was filling her Rx's. I told her to disregard our Rx that the 2 providers did not need to be filling the narcotic.  Caregiver had called on 6/15 and requested a refill. She was told that patient needed a follow up appointment before anymore refills to be given after this one. She made an appointment with Dr. Renato Gails 7/21 for a follow up when she picked up the Rx.

## 2016-02-27 NOTE — Telephone Encounter (Signed)
There has been chronic confusion about her seeing two primary care providers.  I have discussed that we are not a Research scientist (medical) and they need to pick me or Dr. Kevan Ny.  Each time Emily Schultz comes with her daughter all kinds of things have happened that I know nothing about.  Now we learn about this.  I'm going to copy the office manager on this so perhaps she can clarify the situation.

## 2016-03-13 ENCOUNTER — Ambulatory Visit (INDEPENDENT_AMBULATORY_CARE_PROVIDER_SITE_OTHER): Payer: Medicare Other | Admitting: Podiatry

## 2016-03-13 ENCOUNTER — Encounter: Payer: Self-pay | Admitting: Podiatry

## 2016-03-13 DIAGNOSIS — L84 Corns and callosities: Secondary | ICD-10-CM | POA: Diagnosis not present

## 2016-03-13 DIAGNOSIS — M79676 Pain in unspecified toe(s): Secondary | ICD-10-CM | POA: Diagnosis not present

## 2016-03-13 DIAGNOSIS — B351 Tinea unguium: Secondary | ICD-10-CM | POA: Diagnosis not present

## 2016-03-13 NOTE — Progress Notes (Signed)
Patient ID: Emily Schultz, female   DOB: 1922/02/11, 82 y.o.   MRN: [id number]  Subjective: This patient presents again for schedule visit complaining of painful toenails on the right and left feet walking wearing shoes. She is also complaining of a painful corn on the fourth right webspace Patient's daughter present treatment room today  Objective: Orientated 3 The toenails are discolored, hypertrophic, elongated and tender to direct palpation 6-10 Keratoses fourth right webspace without any surrounding erythema, edema or drainage HAV bilaterally  Assessment: Symptomatic onychomycoses 6-10 Keratoses fourth right webspace  Plan: Debridement toenails 6-10 mechanically and electrically without any bleeding Debrided keratoses fourth right webspace without any bleeding  Reappoint 3 months

## 2016-03-24 ENCOUNTER — Other Ambulatory Visit: Payer: Self-pay | Admitting: Internal Medicine

## 2016-03-29 ENCOUNTER — Ambulatory Visit: Payer: Medicare Other | Admitting: Internal Medicine

## 2016-03-29 DIAGNOSIS — Z0289 Encounter for other administrative examinations: Secondary | ICD-10-CM

## 2016-05-08 ENCOUNTER — Other Ambulatory Visit: Payer: Self-pay | Admitting: Internal Medicine

## 2016-05-20 ENCOUNTER — Other Ambulatory Visit: Payer: Self-pay | Admitting: Internal Medicine

## 2016-05-20 ENCOUNTER — Telehealth: Payer: Self-pay

## 2016-05-20 NOTE — Telephone Encounter (Signed)
I called to speak with patient's daughter, Therese Sarah, about the need to either make an appointment or request that other provider refills medications at Dr. Ernest Mallick request. Dr. Renato Gails stated that patient has been seeing another pcp while still being seen at this practice also. Patient has not kept her scheduled appointment at this office and Dr. Renato Gails stated that no more medications are to be filled without an appointment.   Venetia stated that she would have the medications transferred to another provider.

## 2016-05-20 NOTE — Telephone Encounter (Signed)
Yes, she needs an appt.  They have missed multiple appts and see another PCP also.

## 2016-06-04 ENCOUNTER — Other Ambulatory Visit: Payer: Self-pay | Admitting: Internal Medicine

## 2016-06-19 ENCOUNTER — Ambulatory Visit: Payer: Medicare Other | Admitting: Podiatry

## 2016-06-19 ENCOUNTER — Ambulatory Visit (INDEPENDENT_AMBULATORY_CARE_PROVIDER_SITE_OTHER): Payer: Medicare Other | Admitting: Podiatry

## 2016-06-19 DIAGNOSIS — L84 Corns and callosities: Secondary | ICD-10-CM | POA: Diagnosis not present

## 2016-06-19 DIAGNOSIS — M79676 Pain in unspecified toe(s): Secondary | ICD-10-CM | POA: Diagnosis not present

## 2016-06-19 DIAGNOSIS — B351 Tinea unguium: Secondary | ICD-10-CM | POA: Diagnosis not present

## 2016-06-19 DIAGNOSIS — M79671 Pain in right foot: Secondary | ICD-10-CM

## 2016-06-19 DIAGNOSIS — L603 Nail dystrophy: Secondary | ICD-10-CM

## 2016-06-19 DIAGNOSIS — M79672 Pain in left foot: Secondary | ICD-10-CM

## 2016-06-19 DIAGNOSIS — L608 Other nail disorders: Secondary | ICD-10-CM

## 2016-06-19 DIAGNOSIS — L851 Acquired keratosis [keratoderma] palmaris et plantaris: Secondary | ICD-10-CM

## 2016-06-20 NOTE — Progress Notes (Signed)
SUBJECTIVE Patient  presents to office today complaining of elongated, thickened nails. Pain while ambulating in shoes. Patient is unable to trim their own nails.  Patient also has painful callus lesions to the fifth toe of the right foot and a heloma molle in the fourth interdigital webspace of the right foot.  OBJECTIVE General Patient is awake, alert, and oriented x 3 and in no acute distress. Derm hyperkeratotic lesion noted to the lateral aspect of the fifth digit right foot as well as hyperkeratotic lesion noted within the interdigital webspace between toes 4 and 5 right foot. Skin is dry and supple bilateral. Negative open lesions or macerations. Remaining integument unremarkable. Nails are tender, long, thickened and dystrophic with subungual debris, consistent with onychomycosis, 1-5 bilateral. No signs of infection noted. Vasc  DP and PT pedal pulses palpable bilaterally. Temperature gradient within normal limits.  Neuro Epicritic and protective threshold sensation diminished bilaterally.  Musculoskeletal Exam No symptomatic pedal deformities noted bilateral. Muscular strength within normal limits.  ASSESSMENT 1. Onychodystrophic nails 1-5 bilateral with hyperkeratosis of nails.  2. Onychomycosis of nail due to dermatophyte bilateral 3. Pain in foot bilateral  PLAN OF CARE 1. Patient evaluated today.  2. Instructed to maintain good pedal hygiene and foot care.  3. Mechanical debridement of nails 1-5 bilaterally performed using a nail nipper. Filed with dremel without incident.  4. Excisional debridement of the hyperkeratotic lesions noted was performed using a chisel blade without incident.  5. Return to clinic in 3 mos with Dr. Ernestina Columbia, DPM

## 2016-08-21 ENCOUNTER — Encounter: Payer: Self-pay | Admitting: Podiatry

## 2016-08-21 ENCOUNTER — Ambulatory Visit (INDEPENDENT_AMBULATORY_CARE_PROVIDER_SITE_OTHER): Payer: Medicare Other | Admitting: Podiatry

## 2016-08-21 DIAGNOSIS — L84 Corns and callosities: Secondary | ICD-10-CM | POA: Diagnosis not present

## 2016-08-21 DIAGNOSIS — B351 Tinea unguium: Secondary | ICD-10-CM | POA: Diagnosis not present

## 2016-08-21 DIAGNOSIS — M79676 Pain in unspecified toe(s): Secondary | ICD-10-CM | POA: Diagnosis not present

## 2016-08-22 NOTE — Progress Notes (Signed)
Patient ID: Emily Schultz, female   DOB: September 22, 1921, 82 y.o.   MRN: [id number]    Subjective: This patient presents at her request complaining of a painful fourth right webspace corn and request debridement. She also again is requesting debridement of toenails which he said are uncomfortable. This patient was seen for a similar service by me on 03/13/2016 and by Dr. Logan Bores on 06/19/2016 The patient's granddaughters present in the treatment room  History of vascular dementia  Objective: Patient is pleasant and does respond to questioning's with some assisted with granddaughter DP and PT pulses 1/4 bilaterally Capillary reflex immediate bilaterally Pain or calf tenderness or calf edema bilaterally Sensation to 10 g monofilament wire intact 2/5 right 1/5 left Patient had some difficulty understanding the request to respond to the 10 g monofilament wire and tuning fork Vibratory sensation reactive right nonreactive left Ankle reflexes reactive bilaterally No open skin lesions bilaterally Fourth right webspace corn Elongated toenails with occasional texture and color and deformity 6-10  Assessment: Fourth right webspace corn Symptomatic mycotic toenails  Plan: Offered debridement again and patient consents The fourth right webspace corn was debrided without any bleeding A gel toe separator was dispensed insert between the fourth and fifth right toes Toenails 6-10 were debrided mechanically and electrically  Reappoint 3 months

## 2016-11-20 ENCOUNTER — Ambulatory Visit (INDEPENDENT_AMBULATORY_CARE_PROVIDER_SITE_OTHER): Payer: Medicare Other | Admitting: Podiatry

## 2016-11-20 NOTE — Progress Notes (Signed)
No show

## 2016-11-28 ENCOUNTER — Ambulatory Visit: Payer: Medicare Other

## 2016-11-28 ENCOUNTER — Ambulatory Visit: Payer: Medicare Other | Admitting: Internal Medicine

## 2016-12-16 DIAGNOSIS — I2510 Atherosclerotic heart disease of native coronary artery without angina pectoris: Secondary | ICD-10-CM | POA: Diagnosis not present

## 2016-12-16 DIAGNOSIS — F0390 Unspecified dementia without behavioral disturbance: Secondary | ICD-10-CM | POA: Diagnosis not present

## 2016-12-16 DIAGNOSIS — Z9861 Coronary angioplasty status: Secondary | ICD-10-CM | POA: Diagnosis not present

## 2016-12-19 DIAGNOSIS — I10 Essential (primary) hypertension: Secondary | ICD-10-CM | POA: Diagnosis not present

## 2016-12-19 DIAGNOSIS — E785 Hyperlipidemia, unspecified: Secondary | ICD-10-CM | POA: Diagnosis not present

## 2016-12-25 DIAGNOSIS — D72819 Decreased white blood cell count, unspecified: Secondary | ICD-10-CM | POA: Diagnosis not present

## 2016-12-25 DIAGNOSIS — E785 Hyperlipidemia, unspecified: Secondary | ICD-10-CM | POA: Diagnosis not present

## 2016-12-25 DIAGNOSIS — R7989 Other specified abnormal findings of blood chemistry: Secondary | ICD-10-CM | POA: Diagnosis not present

## 2016-12-25 DIAGNOSIS — D649 Anemia, unspecified: Secondary | ICD-10-CM | POA: Diagnosis not present

## 2016-12-27 DIAGNOSIS — Z1211 Encounter for screening for malignant neoplasm of colon: Secondary | ICD-10-CM | POA: Diagnosis not present

## 2017-01-06 DIAGNOSIS — D72819 Decreased white blood cell count, unspecified: Secondary | ICD-10-CM | POA: Diagnosis not present

## 2017-01-14 DIAGNOSIS — E785 Hyperlipidemia, unspecified: Secondary | ICD-10-CM | POA: Diagnosis not present

## 2017-01-14 DIAGNOSIS — N183 Chronic kidney disease, stage 3 (moderate): Secondary | ICD-10-CM | POA: Diagnosis not present

## 2017-01-14 DIAGNOSIS — I129 Hypertensive chronic kidney disease with stage 1 through stage 4 chronic kidney disease, or unspecified chronic kidney disease: Secondary | ICD-10-CM | POA: Diagnosis not present

## 2017-01-14 DIAGNOSIS — E559 Vitamin D deficiency, unspecified: Secondary | ICD-10-CM | POA: Diagnosis not present

## 2017-01-14 DIAGNOSIS — D649 Anemia, unspecified: Secondary | ICD-10-CM | POA: Diagnosis not present

## 2017-01-14 DIAGNOSIS — K219 Gastro-esophageal reflux disease without esophagitis: Secondary | ICD-10-CM | POA: Diagnosis not present

## 2017-01-14 DIAGNOSIS — R600 Localized edema: Secondary | ICD-10-CM | POA: Diagnosis not present

## 2017-01-14 DIAGNOSIS — N2581 Secondary hyperparathyroidism of renal origin: Secondary | ICD-10-CM | POA: Diagnosis not present

## 2017-01-17 DIAGNOSIS — N281 Cyst of kidney, acquired: Secondary | ICD-10-CM | POA: Diagnosis not present

## 2017-01-17 DIAGNOSIS — N183 Chronic kidney disease, stage 3 (moderate): Secondary | ICD-10-CM | POA: Diagnosis not present

## 2017-01-17 DIAGNOSIS — Q6102 Congenital multiple renal cysts: Secondary | ICD-10-CM | POA: Diagnosis not present

## 2017-01-25 DIAGNOSIS — M545 Low back pain: Secondary | ICD-10-CM | POA: Diagnosis not present

## 2017-01-25 DIAGNOSIS — N898 Other specified noninflammatory disorders of vagina: Secondary | ICD-10-CM | POA: Diagnosis not present

## 2017-01-25 DIAGNOSIS — R350 Frequency of micturition: Secondary | ICD-10-CM | POA: Diagnosis not present

## 2017-02-11 DIAGNOSIS — N390 Urinary tract infection, site not specified: Secondary | ICD-10-CM | POA: Diagnosis not present

## 2017-02-11 DIAGNOSIS — N399 Disorder of urinary system, unspecified: Secondary | ICD-10-CM | POA: Diagnosis not present

## 2017-02-11 DIAGNOSIS — R809 Proteinuria, unspecified: Secondary | ICD-10-CM | POA: Diagnosis not present

## 2017-02-24 DIAGNOSIS — K219 Gastro-esophageal reflux disease without esophagitis: Secondary | ICD-10-CM | POA: Diagnosis not present

## 2017-02-24 DIAGNOSIS — E538 Deficiency of other specified B group vitamins: Secondary | ICD-10-CM | POA: Diagnosis not present

## 2017-02-24 DIAGNOSIS — D649 Anemia, unspecified: Secondary | ICD-10-CM | POA: Diagnosis not present

## 2017-02-24 DIAGNOSIS — E785 Hyperlipidemia, unspecified: Secondary | ICD-10-CM | POA: Diagnosis not present

## 2017-02-24 DIAGNOSIS — N183 Chronic kidney disease, stage 3 (moderate): Secondary | ICD-10-CM | POA: Diagnosis not present

## 2017-02-24 DIAGNOSIS — N2581 Secondary hyperparathyroidism of renal origin: Secondary | ICD-10-CM | POA: Diagnosis not present

## 2017-02-24 DIAGNOSIS — N179 Acute kidney failure, unspecified: Secondary | ICD-10-CM | POA: Diagnosis not present

## 2017-02-24 DIAGNOSIS — E559 Vitamin D deficiency, unspecified: Secondary | ICD-10-CM | POA: Diagnosis not present

## 2017-02-24 DIAGNOSIS — I129 Hypertensive chronic kidney disease with stage 1 through stage 4 chronic kidney disease, or unspecified chronic kidney disease: Secondary | ICD-10-CM | POA: Diagnosis not present

## 2017-02-24 DIAGNOSIS — R600 Localized edema: Secondary | ICD-10-CM | POA: Diagnosis not present

## 2017-03-03 DIAGNOSIS — M545 Low back pain: Secondary | ICD-10-CM | POA: Diagnosis not present

## 2017-03-05 DIAGNOSIS — N183 Chronic kidney disease, stage 3 (moderate): Secondary | ICD-10-CM | POA: Diagnosis not present

## 2017-03-11 DIAGNOSIS — Z87891 Personal history of nicotine dependence: Secondary | ICD-10-CM | POA: Diagnosis not present

## 2017-03-11 DIAGNOSIS — I10 Essential (primary) hypertension: Secondary | ICD-10-CM | POA: Diagnosis not present

## 2017-03-11 DIAGNOSIS — R531 Weakness: Secondary | ICD-10-CM | POA: Diagnosis not present

## 2017-03-11 DIAGNOSIS — K219 Gastro-esophageal reflux disease without esophagitis: Secondary | ICD-10-CM | POA: Diagnosis not present

## 2017-03-11 DIAGNOSIS — Z79899 Other long term (current) drug therapy: Secondary | ICD-10-CM | POA: Diagnosis not present

## 2017-03-11 DIAGNOSIS — R410 Disorientation, unspecified: Secondary | ICD-10-CM | POA: Diagnosis not present

## 2017-03-11 DIAGNOSIS — K8020 Calculus of gallbladder without cholecystitis without obstruction: Secondary | ICD-10-CM | POA: Diagnosis not present

## 2017-03-17 DIAGNOSIS — R011 Cardiac murmur, unspecified: Secondary | ICD-10-CM | POA: Diagnosis not present

## 2017-03-17 DIAGNOSIS — K921 Melena: Secondary | ICD-10-CM | POA: Diagnosis not present

## 2017-03-17 DIAGNOSIS — D649 Anemia, unspecified: Secondary | ICD-10-CM | POA: Diagnosis not present

## 2017-03-17 DIAGNOSIS — I2510 Atherosclerotic heart disease of native coronary artery without angina pectoris: Secondary | ICD-10-CM | POA: Diagnosis not present

## 2017-03-17 DIAGNOSIS — N184 Chronic kidney disease, stage 4 (severe): Secondary | ICD-10-CM | POA: Diagnosis not present

## 2017-03-21 DIAGNOSIS — K8010 Calculus of gallbladder with chronic cholecystitis without obstruction: Secondary | ICD-10-CM | POA: Diagnosis not present

## 2017-03-21 DIAGNOSIS — K851 Biliary acute pancreatitis without necrosis or infection: Secondary | ICD-10-CM | POA: Diagnosis not present

## 2017-03-21 DIAGNOSIS — R1011 Right upper quadrant pain: Secondary | ICD-10-CM | POA: Diagnosis not present

## 2017-04-01 ENCOUNTER — Telehealth: Payer: Self-pay | Admitting: General Practice

## 2017-04-01 NOTE — Telephone Encounter (Signed)
I called to reschedule AWV and OV.  No visits in office this year.  There was no answer and no option to leave a message. VDM (DD)

## 2017-04-11 DIAGNOSIS — R9431 Abnormal electrocardiogram [ECG] [EKG]: Secondary | ICD-10-CM | POA: Diagnosis not present

## 2017-04-11 DIAGNOSIS — N2581 Secondary hyperparathyroidism of renal origin: Secondary | ICD-10-CM | POA: Diagnosis not present

## 2017-04-11 DIAGNOSIS — E559 Vitamin D deficiency, unspecified: Secondary | ICD-10-CM | POA: Diagnosis not present

## 2017-04-11 DIAGNOSIS — N183 Chronic kidney disease, stage 3 (moderate): Secondary | ICD-10-CM | POA: Diagnosis not present

## 2017-04-11 DIAGNOSIS — I129 Hypertensive chronic kidney disease with stage 1 through stage 4 chronic kidney disease, or unspecified chronic kidney disease: Secondary | ICD-10-CM | POA: Diagnosis not present

## 2017-04-11 DIAGNOSIS — R600 Localized edema: Secondary | ICD-10-CM | POA: Diagnosis not present

## 2017-04-11 DIAGNOSIS — I219 Acute myocardial infarction, unspecified: Secondary | ICD-10-CM | POA: Diagnosis not present

## 2017-04-11 DIAGNOSIS — K219 Gastro-esophageal reflux disease without esophagitis: Secondary | ICD-10-CM | POA: Diagnosis not present

## 2017-04-11 DIAGNOSIS — E785 Hyperlipidemia, unspecified: Secondary | ICD-10-CM | POA: Diagnosis not present

## 2017-04-11 DIAGNOSIS — Z01810 Encounter for preprocedural cardiovascular examination: Secondary | ICD-10-CM | POA: Diagnosis not present

## 2017-04-11 DIAGNOSIS — E538 Deficiency of other specified B group vitamins: Secondary | ICD-10-CM | POA: Diagnosis not present

## 2017-04-11 DIAGNOSIS — D649 Anemia, unspecified: Secondary | ICD-10-CM | POA: Diagnosis not present

## 2017-04-11 DIAGNOSIS — Z01812 Encounter for preprocedural laboratory examination: Secondary | ICD-10-CM | POA: Diagnosis not present

## 2017-04-15 DIAGNOSIS — I350 Nonrheumatic aortic (valve) stenosis: Secondary | ICD-10-CM | POA: Diagnosis not present

## 2017-04-15 DIAGNOSIS — I10 Essential (primary) hypertension: Secondary | ICD-10-CM | POA: Diagnosis not present

## 2017-04-15 DIAGNOSIS — I25118 Atherosclerotic heart disease of native coronary artery with other forms of angina pectoris: Secondary | ICD-10-CM | POA: Diagnosis not present

## 2017-05-21 DIAGNOSIS — M2042 Other hammer toe(s) (acquired), left foot: Secondary | ICD-10-CM | POA: Diagnosis not present

## 2017-05-21 DIAGNOSIS — L603 Nail dystrophy: Secondary | ICD-10-CM | POA: Diagnosis not present

## 2017-05-21 DIAGNOSIS — B351 Tinea unguium: Secondary | ICD-10-CM | POA: Diagnosis not present

## 2017-05-21 DIAGNOSIS — M2041 Other hammer toe(s) (acquired), right foot: Secondary | ICD-10-CM | POA: Diagnosis not present

## 2017-05-21 DIAGNOSIS — L608 Other nail disorders: Secondary | ICD-10-CM | POA: Diagnosis not present

## 2017-05-21 DIAGNOSIS — M79674 Pain in right toe(s): Secondary | ICD-10-CM | POA: Diagnosis not present

## 2017-05-21 DIAGNOSIS — L84 Corns and callosities: Secondary | ICD-10-CM | POA: Diagnosis not present

## 2017-05-21 DIAGNOSIS — M79675 Pain in left toe(s): Secondary | ICD-10-CM | POA: Diagnosis not present

## 2017-05-24 DIAGNOSIS — I10 Essential (primary) hypertension: Secondary | ICD-10-CM | POA: Diagnosis not present

## 2017-05-24 DIAGNOSIS — I361 Nonrheumatic tricuspid (valve) insufficiency: Secondary | ICD-10-CM | POA: Diagnosis not present

## 2017-05-24 DIAGNOSIS — G8191 Hemiplegia, unspecified affecting right dominant side: Secondary | ICD-10-CM | POA: Diagnosis not present

## 2017-05-24 DIAGNOSIS — Z7982 Long term (current) use of aspirin: Secondary | ICD-10-CM | POA: Diagnosis not present

## 2017-05-24 DIAGNOSIS — R6889 Other general symptoms and signs: Secondary | ICD-10-CM | POA: Diagnosis not present

## 2017-05-24 DIAGNOSIS — G459 Transient cerebral ischemic attack, unspecified: Secondary | ICD-10-CM | POA: Diagnosis present

## 2017-05-24 DIAGNOSIS — I161 Hypertensive emergency: Secondary | ICD-10-CM | POA: Diagnosis present

## 2017-05-24 DIAGNOSIS — I352 Nonrheumatic aortic (valve) stenosis with insufficiency: Secondary | ICD-10-CM | POA: Diagnosis not present

## 2017-05-24 DIAGNOSIS — I129 Hypertensive chronic kidney disease with stage 1 through stage 4 chronic kidney disease, or unspecified chronic kidney disease: Secondary | ICD-10-CM | POA: Diagnosis present

## 2017-05-24 DIAGNOSIS — I639 Cerebral infarction, unspecified: Secondary | ICD-10-CM | POA: Diagnosis not present

## 2017-05-24 DIAGNOSIS — R29898 Other symptoms and signs involving the musculoskeletal system: Secondary | ICD-10-CM | POA: Diagnosis not present

## 2017-05-24 DIAGNOSIS — I340 Nonrheumatic mitral (valve) insufficiency: Secondary | ICD-10-CM | POA: Diagnosis not present

## 2017-05-24 DIAGNOSIS — Z87891 Personal history of nicotine dependence: Secondary | ICD-10-CM | POA: Diagnosis not present

## 2017-05-24 DIAGNOSIS — R2689 Other abnormalities of gait and mobility: Secondary | ICD-10-CM | POA: Diagnosis not present

## 2017-05-24 DIAGNOSIS — R4182 Altered mental status, unspecified: Secondary | ICD-10-CM | POA: Diagnosis not present

## 2017-05-24 DIAGNOSIS — N183 Chronic kidney disease, stage 3 (moderate): Secondary | ICD-10-CM | POA: Diagnosis not present

## 2017-05-24 DIAGNOSIS — K219 Gastro-esophageal reflux disease without esophagitis: Secondary | ICD-10-CM | POA: Diagnosis present

## 2017-05-24 DIAGNOSIS — Z79899 Other long term (current) drug therapy: Secondary | ICD-10-CM | POA: Diagnosis not present

## 2017-05-24 DIAGNOSIS — M109 Gout, unspecified: Secondary | ICD-10-CM | POA: Diagnosis present

## 2017-05-24 DIAGNOSIS — F0390 Unspecified dementia without behavioral disturbance: Secondary | ICD-10-CM | POA: Diagnosis present

## 2017-05-24 DIAGNOSIS — R200 Anesthesia of skin: Secondary | ICD-10-CM | POA: Diagnosis not present

## 2017-06-04 DIAGNOSIS — Z23 Encounter for immunization: Secondary | ICD-10-CM | POA: Diagnosis not present

## 2017-06-04 DIAGNOSIS — I2510 Atherosclerotic heart disease of native coronary artery without angina pectoris: Secondary | ICD-10-CM | POA: Diagnosis not present

## 2017-06-04 DIAGNOSIS — I10 Essential (primary) hypertension: Secondary | ICD-10-CM | POA: Diagnosis not present

## 2017-06-04 DIAGNOSIS — G8191 Hemiplegia, unspecified affecting right dominant side: Secondary | ICD-10-CM | POA: Diagnosis not present

## 2017-07-21 DIAGNOSIS — G8191 Hemiplegia, unspecified affecting right dominant side: Secondary | ICD-10-CM | POA: Diagnosis not present

## 2017-07-21 DIAGNOSIS — R001 Bradycardia, unspecified: Secondary | ICD-10-CM | POA: Diagnosis not present

## 2017-07-21 DIAGNOSIS — R42 Dizziness and giddiness: Secondary | ICD-10-CM | POA: Diagnosis not present

## 2017-07-21 DIAGNOSIS — I499 Cardiac arrhythmia, unspecified: Secondary | ICD-10-CM | POA: Diagnosis not present

## 2017-07-21 DIAGNOSIS — R404 Transient alteration of awareness: Secondary | ICD-10-CM | POA: Diagnosis not present

## 2017-07-21 DIAGNOSIS — R4182 Altered mental status, unspecified: Secondary | ICD-10-CM | POA: Diagnosis not present

## 2017-07-22 DIAGNOSIS — R404 Transient alteration of awareness: Secondary | ICD-10-CM | POA: Diagnosis not present

## 2017-07-22 DIAGNOSIS — R627 Adult failure to thrive: Secondary | ICD-10-CM | POA: Diagnosis not present

## 2017-07-22 DIAGNOSIS — R001 Bradycardia, unspecified: Secondary | ICD-10-CM | POA: Diagnosis not present

## 2017-07-23 DIAGNOSIS — M109 Gout, unspecified: Secondary | ICD-10-CM | POA: Diagnosis present

## 2017-07-23 DIAGNOSIS — R1312 Dysphagia, oropharyngeal phase: Secondary | ICD-10-CM | POA: Diagnosis not present

## 2017-07-23 DIAGNOSIS — I442 Atrioventricular block, complete: Secondary | ICD-10-CM | POA: Diagnosis not present

## 2017-07-23 DIAGNOSIS — I63322 Cerebral infarction due to thrombosis of left anterior cerebral artery: Secondary | ICD-10-CM | POA: Diagnosis not present

## 2017-07-23 DIAGNOSIS — G8191 Hemiplegia, unspecified affecting right dominant side: Secondary | ICD-10-CM | POA: Diagnosis present

## 2017-07-23 DIAGNOSIS — N183 Chronic kidney disease, stage 3 (moderate): Secondary | ICD-10-CM | POA: Diagnosis present

## 2017-07-23 DIAGNOSIS — G63 Polyneuropathy in diseases classified elsewhere: Secondary | ICD-10-CM | POA: Diagnosis not present

## 2017-07-23 DIAGNOSIS — I10 Essential (primary) hypertension: Secondary | ICD-10-CM | POA: Diagnosis not present

## 2017-07-23 DIAGNOSIS — R41841 Cognitive communication deficit: Secondary | ICD-10-CM | POA: Diagnosis not present

## 2017-07-23 DIAGNOSIS — I6389 Other cerebral infarction: Secondary | ICD-10-CM | POA: Diagnosis not present

## 2017-07-23 DIAGNOSIS — R627 Adult failure to thrive: Secondary | ICD-10-CM | POA: Diagnosis not present

## 2017-07-23 DIAGNOSIS — G9341 Metabolic encephalopathy: Secondary | ICD-10-CM | POA: Diagnosis not present

## 2017-07-23 DIAGNOSIS — I340 Nonrheumatic mitral (valve) insufficiency: Secondary | ICD-10-CM | POA: Diagnosis not present

## 2017-07-23 DIAGNOSIS — Z87891 Personal history of nicotine dependence: Secondary | ICD-10-CM | POA: Diagnosis not present

## 2017-07-23 DIAGNOSIS — H9190 Unspecified hearing loss, unspecified ear: Secondary | ICD-10-CM | POA: Diagnosis present

## 2017-07-23 DIAGNOSIS — E639 Nutritional deficiency, unspecified: Secondary | ICD-10-CM | POA: Diagnosis not present

## 2017-07-23 DIAGNOSIS — N189 Chronic kidney disease, unspecified: Secondary | ICD-10-CM | POA: Diagnosis not present

## 2017-07-23 DIAGNOSIS — R400 Somnolence: Secondary | ICD-10-CM | POA: Diagnosis not present

## 2017-07-23 DIAGNOSIS — R404 Transient alteration of awareness: Secondary | ICD-10-CM | POA: Diagnosis not present

## 2017-07-23 DIAGNOSIS — K5900 Constipation, unspecified: Secondary | ICD-10-CM | POA: Diagnosis not present

## 2017-07-23 DIAGNOSIS — G379 Demyelinating disease of central nervous system, unspecified: Secondary | ICD-10-CM | POA: Diagnosis not present

## 2017-07-23 DIAGNOSIS — G459 Transient cerebral ischemic attack, unspecified: Secondary | ICD-10-CM | POA: Diagnosis not present

## 2017-07-23 DIAGNOSIS — R410 Disorientation, unspecified: Secondary | ICD-10-CM | POA: Diagnosis not present

## 2017-07-23 DIAGNOSIS — I129 Hypertensive chronic kidney disease with stage 1 through stage 4 chronic kidney disease, or unspecified chronic kidney disease: Secondary | ICD-10-CM | POA: Diagnosis present

## 2017-07-23 DIAGNOSIS — F0390 Unspecified dementia without behavioral disturbance: Secondary | ICD-10-CM | POA: Diagnosis not present

## 2017-07-23 DIAGNOSIS — I639 Cerebral infarction, unspecified: Secondary | ICD-10-CM | POA: Diagnosis not present

## 2017-07-23 DIAGNOSIS — I350 Nonrheumatic aortic (valve) stenosis: Secondary | ICD-10-CM | POA: Diagnosis present

## 2017-07-23 DIAGNOSIS — F0150 Vascular dementia without behavioral disturbance: Secondary | ICD-10-CM | POA: Diagnosis not present

## 2017-07-23 DIAGNOSIS — R001 Bradycardia, unspecified: Secondary | ICD-10-CM | POA: Diagnosis present

## 2017-07-23 DIAGNOSIS — I352 Nonrheumatic aortic (valve) stenosis with insufficiency: Secondary | ICD-10-CM | POA: Diagnosis not present

## 2017-07-23 DIAGNOSIS — K219 Gastro-esophageal reflux disease without esophagitis: Secondary | ICD-10-CM | POA: Diagnosis present

## 2017-07-23 DIAGNOSIS — I6339 Cerebral infarction due to thrombosis of other cerebral artery: Secondary | ICD-10-CM | POA: Diagnosis not present

## 2017-07-23 DIAGNOSIS — R4182 Altered mental status, unspecified: Secondary | ICD-10-CM | POA: Diagnosis not present

## 2017-07-23 DIAGNOSIS — R2689 Other abnormalities of gait and mobility: Secondary | ICD-10-CM | POA: Diagnosis not present

## 2017-07-23 DIAGNOSIS — R278 Other lack of coordination: Secondary | ICD-10-CM | POA: Diagnosis not present

## 2017-07-23 DIAGNOSIS — R42 Dizziness and giddiness: Secondary | ICD-10-CM | POA: Diagnosis present

## 2017-07-23 DIAGNOSIS — Z66 Do not resuscitate: Secondary | ICD-10-CM | POA: Diagnosis present

## 2017-07-23 DIAGNOSIS — I361 Nonrheumatic tricuspid (valve) insufficiency: Secondary | ICD-10-CM | POA: Diagnosis not present

## 2017-08-12 DIAGNOSIS — F0390 Unspecified dementia without behavioral disturbance: Secondary | ICD-10-CM | POA: Diagnosis not present

## 2017-08-12 DIAGNOSIS — R1312 Dysphagia, oropharyngeal phase: Secondary | ICD-10-CM | POA: Diagnosis not present

## 2017-08-12 DIAGNOSIS — G63 Polyneuropathy in diseases classified elsewhere: Secondary | ICD-10-CM | POA: Diagnosis not present

## 2017-08-12 DIAGNOSIS — I639 Cerebral infarction, unspecified: Secondary | ICD-10-CM | POA: Diagnosis not present

## 2017-08-12 DIAGNOSIS — N189 Chronic kidney disease, unspecified: Secondary | ICD-10-CM | POA: Diagnosis not present

## 2017-08-12 DIAGNOSIS — I10 Essential (primary) hypertension: Secondary | ICD-10-CM | POA: Diagnosis not present

## 2017-08-12 DIAGNOSIS — R404 Transient alteration of awareness: Secondary | ICD-10-CM | POA: Diagnosis not present

## 2017-08-12 DIAGNOSIS — R278 Other lack of coordination: Secondary | ICD-10-CM | POA: Diagnosis not present

## 2017-08-12 DIAGNOSIS — R4182 Altered mental status, unspecified: Secondary | ICD-10-CM | POA: Diagnosis not present

## 2017-08-12 DIAGNOSIS — K219 Gastro-esophageal reflux disease without esophagitis: Secondary | ICD-10-CM | POA: Diagnosis not present

## 2017-08-12 DIAGNOSIS — R41841 Cognitive communication deficit: Secondary | ICD-10-CM | POA: Diagnosis not present

## 2017-08-12 DIAGNOSIS — E639 Nutritional deficiency, unspecified: Secondary | ICD-10-CM | POA: Diagnosis not present

## 2017-08-12 DIAGNOSIS — R2689 Other abnormalities of gait and mobility: Secondary | ICD-10-CM | POA: Diagnosis not present

## 2017-08-12 DIAGNOSIS — I469 Cardiac arrest, cause unspecified: Secondary | ICD-10-CM | POA: Diagnosis not present

## 2017-08-12 DIAGNOSIS — G459 Transient cerebral ischemic attack, unspecified: Secondary | ICD-10-CM | POA: Diagnosis not present

## 2017-08-19 DIAGNOSIS — I469 Cardiac arrest, cause unspecified: Secondary | ICD-10-CM | POA: Diagnosis not present

## 2017-08-19 DIAGNOSIS — E639 Nutritional deficiency, unspecified: Secondary | ICD-10-CM | POA: Diagnosis not present

## 2017-08-19 DIAGNOSIS — I639 Cerebral infarction, unspecified: Secondary | ICD-10-CM | POA: Diagnosis not present

## 2017-08-19 DIAGNOSIS — G63 Polyneuropathy in diseases classified elsewhere: Secondary | ICD-10-CM | POA: Diagnosis not present

## 2017-08-19 DIAGNOSIS — K219 Gastro-esophageal reflux disease without esophagitis: Secondary | ICD-10-CM | POA: Diagnosis not present

## 2017-09-09 DEATH — deceased
